# Patient Record
Sex: Female | Born: 1990 | Race: Black or African American | Hispanic: No | Marital: Single | Smoking: Never smoker
Health system: Southern US, Community
[De-identification: ages and names within clinical notes are randomized; demographics above are authoritative.]

## PROBLEM LIST (undated history)

## (undated) ENCOUNTER — Inpatient Hospital Stay (HOSPITAL_COMMUNITY): Payer: Self-pay

## (undated) DIAGNOSIS — B009 Herpesviral infection, unspecified: Secondary | ICD-10-CM

## (undated) HISTORY — PX: TONSILLECTOMY: SUR1361

---
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
## 2011-07-22 ENCOUNTER — Emergency Department (HOSPITAL_COMMUNITY)
Admission: EM | Admit: 2011-07-22 | Discharge: 2011-07-22 | Disposition: A | Discharge status: Home / Self Care | Payer: Medicaid Other | Attending: Emergency Medicine | Admitting: Emergency Medicine

## 2011-07-22 ENCOUNTER — Encounter: Payer: Self-pay | Admitting: *Deleted

## 2011-07-22 DIAGNOSIS — N390 Urinary tract infection, site not specified: Secondary | ICD-10-CM | POA: Insufficient documentation

## 2011-07-22 DIAGNOSIS — N898 Other specified noninflammatory disorders of vagina: Secondary | ICD-10-CM | POA: Insufficient documentation

## 2011-07-22 HISTORY — DX: Herpesviral infection, unspecified: B00.9

## 2011-07-22 LAB — URINALYSIS, ROUTINE W REFLEX MICROSCOPIC
Glucose, UA: NEGATIVE mg/dL
Protein, ur: NEGATIVE mg/dL
Urobilinogen, UA: 0.2 mg/dL (ref 0.0–1.0)

## 2011-07-22 LAB — URINE MICROSCOPIC-ADD ON

## 2011-07-22 LAB — POCT PREGNANCY, URINE: Preg Test, Ur: NEGATIVE

## 2011-07-22 MED ORDER — SULFAMETHOXAZOLE-TRIMETHOPRIM 800-160 MG PO TABS: 1.0000 | ORAL_TABLET | Freq: Two times a day (BID) | ORAL | Status: AC

## 2011-07-22 NOTE — ED Provider Notes (Signed)
History     CSN: [id number] Arrival date & time: 07/22/2011  3:08 PM   First MD Initiated Contact with Patient 07/22/11 1642    4:51 PM HPI Alaiah Lundy is a 20 y.o. female complaining of vaginal discharge and vulvar itching for 3-4 days. Reports symptoms also associated with dysuria, urinary frequency, urinary urgency. Denies abdominal pain fever or back pain. Admits to being sexually active. Uses OCP and condoms. 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 No history of pregnancies or abdominal surgeries. Patient is a 20 y.o. female presenting with vaginal discharge. The history is provided by the patient.  Vaginal Discharge This is a new problem. The current episode started in the past 7 days. The problem occurs constantly. The problem has been unchanged. Associated symptoms include urinary symptoms. Pertinent negatives include no abdominal pain, chills, congestion, coughing, fatigue, fever, nausea, neck pain, rash, vomiting or weakness. Associated symptoms comments: Vulvar pruritus. Treatments tried: Monistat 1. The treatment provided no relief.    Past Medical History  Diagnosis Date  . Herpes     Past Surgical History  Procedure Date  . Tonsillectomy     No family history on file.  History  Substance Use Topics  . Smoking status: Never Smoker   . Smokeless tobacco: Not on file  . Alcohol Use: No    OB History    Grav Para Term Preterm Abortions TAB SAB Ect Mult Living                  Review of Systems  Constitutional: Negative for fever, chills and fatigue.  HENT: Negative for congestion and neck pain.   Respiratory: Negative for cough.   Gastrointestinal: Negative for nausea, vomiting and abdominal pain.  Genitourinary: Positive for dysuria, urgency, frequency and vaginal discharge. Negative for vaginal pain.       Vulvar pruritis  Skin: Negative for rash.  Neurological: Negative for weakness.  All other systems reviewed and are negative.    Allergies  Review  of patient's allergies indicates no known allergies.  Home Medications   Current Outpatient Rx  Name Route Sig Dispense Refill  . LEVONORGESTREL-ETHINYL ESTRAD 0.1-20 MG-MCG PO TABS Oral Take 1 tablet by mouth daily.      Marland Kitchen VALACYCLOVIR HCL 1 G PO TABS Oral Take 1,000 mg by mouth daily.        BP 113/73  Pulse 86  Temp(Src) 98.5 F (36.9 C) (Oral)  Resp 16  Wt 141 lb 15.6 oz (64.4 kg)  SpO2 99%  LMP 07/01/2011  Physical Exam  Vitals reviewed. Constitutional: She is oriented to person, place, and time. Vital signs are normal. She appears well-developed and well-nourished.  HENT:  Head: Normocephalic and atraumatic.  Eyes: Conjunctivae are normal. Pupils are equal, round, and reactive to light.  Neck: Normal range of motion. Neck supple.  Cardiovascular: Normal rate, regular rhythm and normal heart sounds.  Exam reveals no friction rub.   No murmur heard. Pulmonary/Chest: Effort normal and breath sounds normal. She has no wheezes. She has no rhonchi. She has no rales. She exhibits no tenderness.  Abdominal: Soft. Bowel sounds are normal. She exhibits no distension and no mass. There is no tenderness. There is no rebound and no guarding.  Musculoskeletal: Normal range of motion.  Neurological: She is alert and oriented to person, place, and time. Coordination normal.  Skin: Skin is warm and dry. No rash noted. No erythema. No pallor.    ED Course  Procedures  MDM   5:55 PM Patient now states she wants to leave prior to having pelvic exam. Pelvic exam is set up and I am in the room I asked patient twice whether she definitely wants to leave. Advised patient that I would be unable to diagnose her vaginal discharge and vaginal itching without a pelvic exam. Patient states she does her care and would like to leave. States she would like a work note. Will give her prescription for Bactrim since urine came back positive for UTI.  Thomasene Lot, [place] 07/22/11 [phone]

## 2011-07-22 NOTE — ED Notes (Signed)
Pt states "I have been having some vaginal d/c, itching, thought I had a UTI, used some monistat, why do I keep having UTI's?"

## 2011-07-22 NOTE — ED Notes (Signed)
Pelvic set up for patient but, she refused.  PA  In to speak with her about refusing pelvic but, nonetheless she continues to refuse.

## 2011-07-26 NOTE — ED Provider Notes (Signed)
Evaluation and management procedures were performed by the PA/NP under my supervision/collaboration.    Felisa Bonier, MD 07/26/11 [phone]

## 2011-09-21 IMAGING — US US PELVIS COMPLETE MODIFY
1 series · 14 of 25 positions shown · non-contrast
Comparison: None.

CLINICAL DATA: Vaginal bleeding.  [place] menorrhagia

TRANSABDOMINAL AND TRANSVAGINAL ULTRASOUND OF PELVIS
TECHNIQUE: Both transabdominal and transvaginal ultrasound
examinations of the pelvis were performed including evaluation of
the uterus, ovaries, adnexal regions, and pelvic cul-de-sac.

[Series 1: us pelvis complete modify · 0.28mm/px · 14 of 33 slices shown]
[im 1/33]
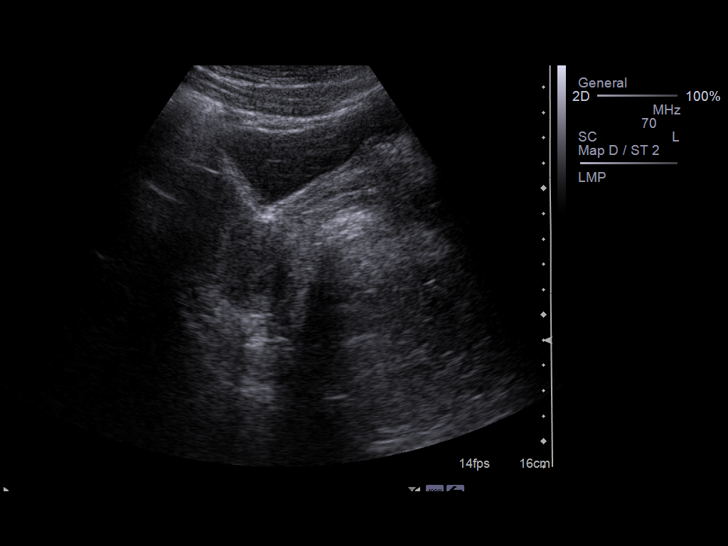
[im 3/33]
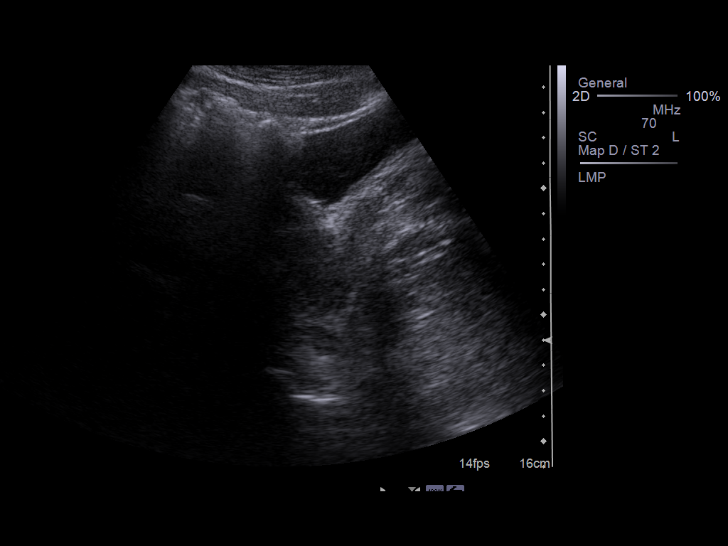
[im 6/33]
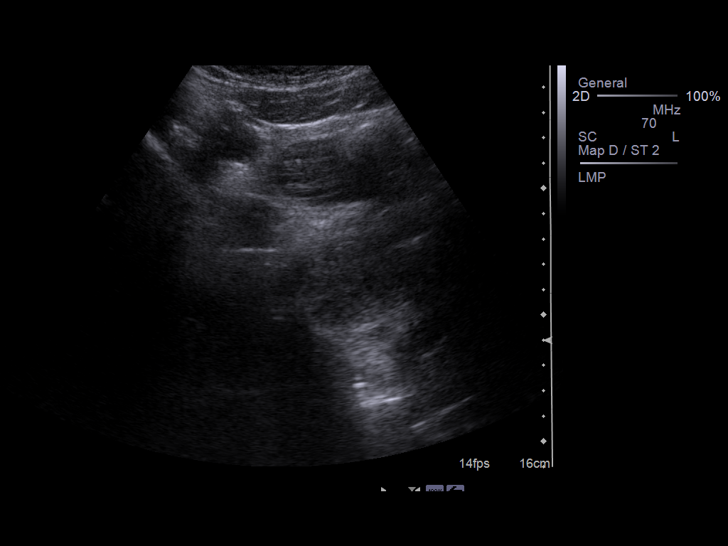
[im 9/33]
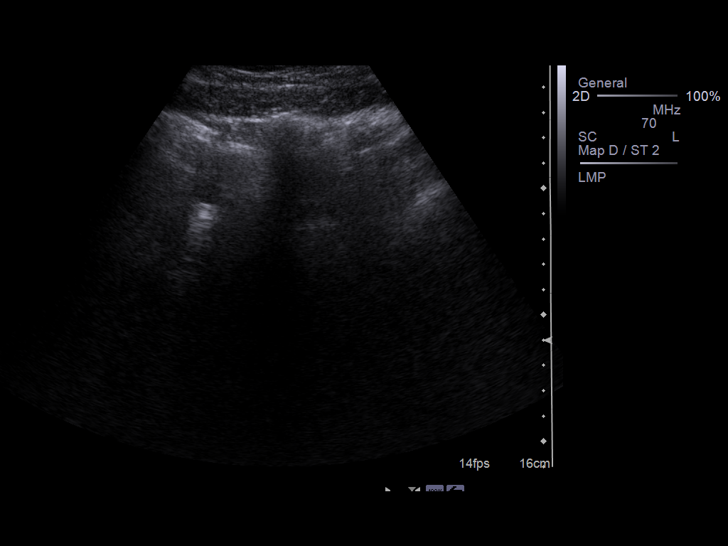
[im 11/33]
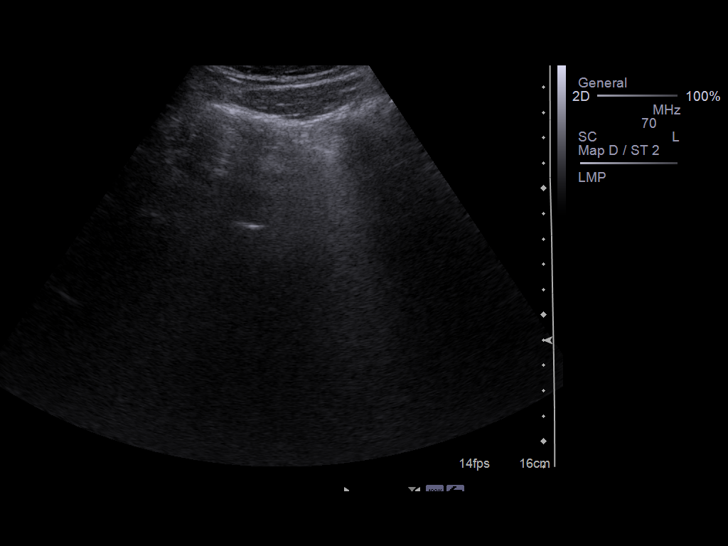
[im 13/33]
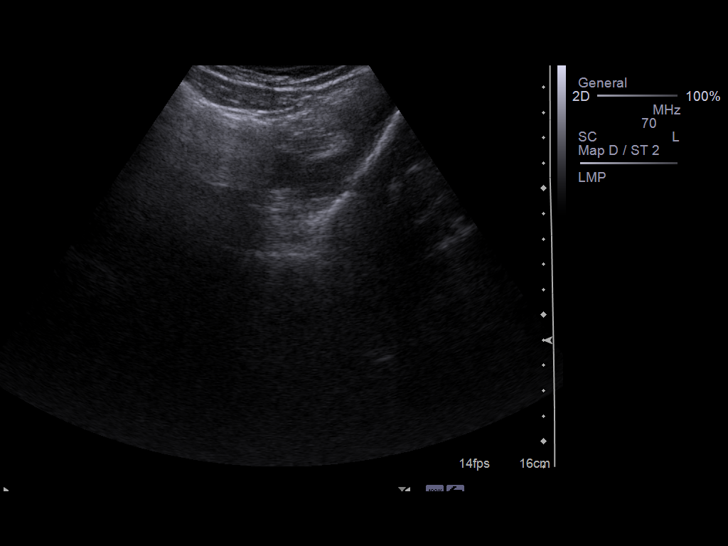
[im 15/33]
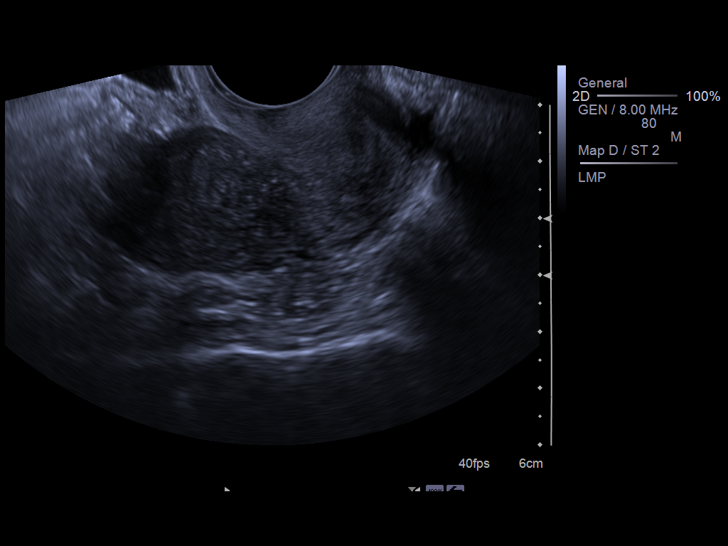
[im 18/33]
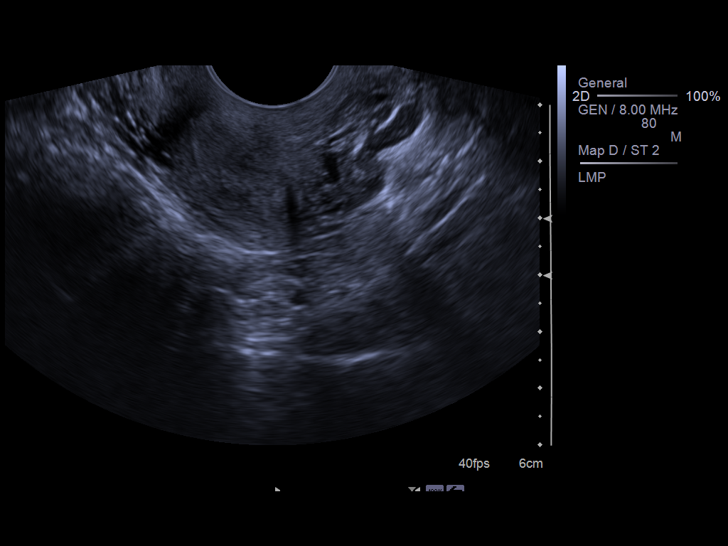
[im 21/33]
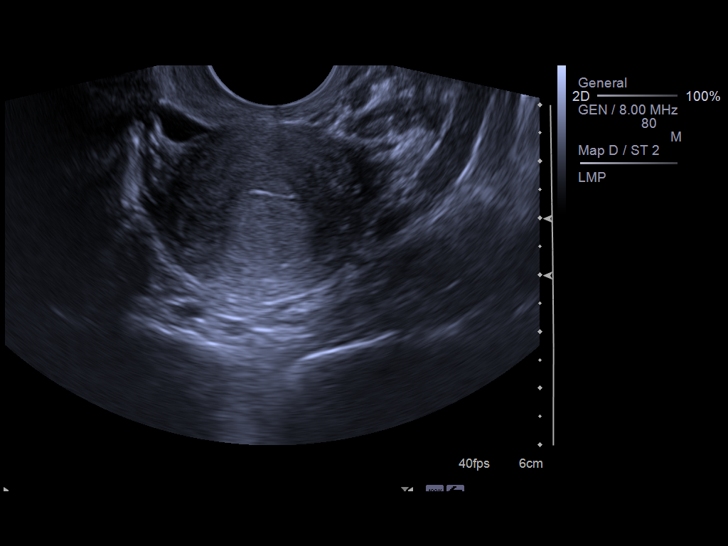
[im 22/33]
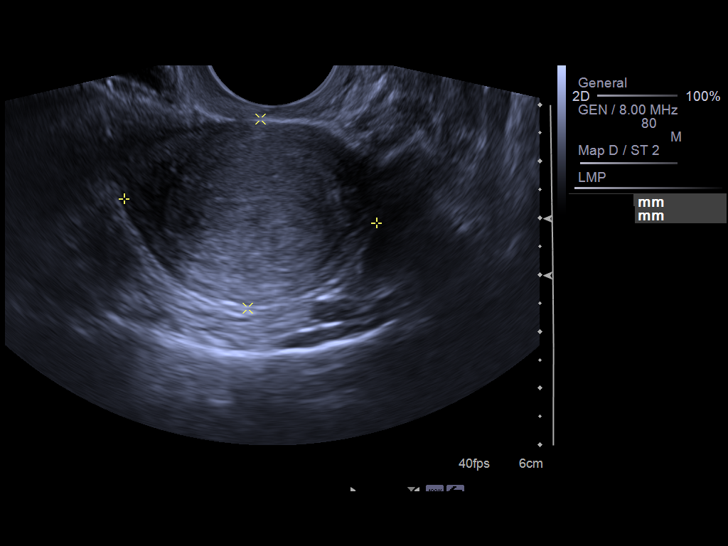
[im 25/33]
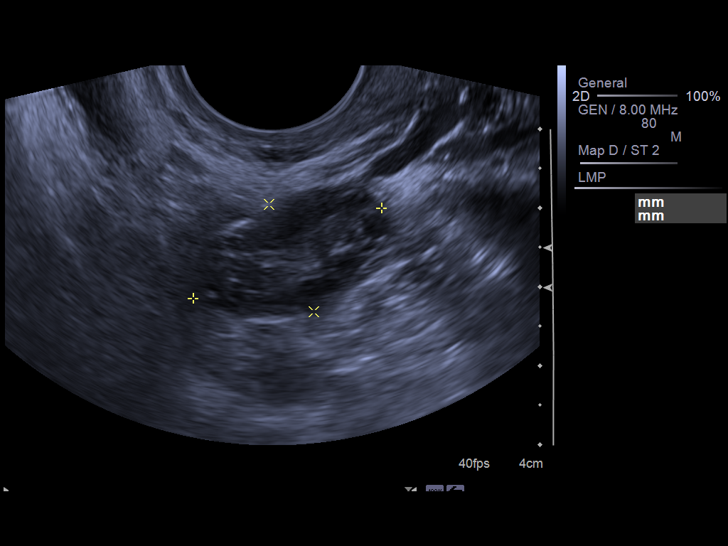
[im 27/33]
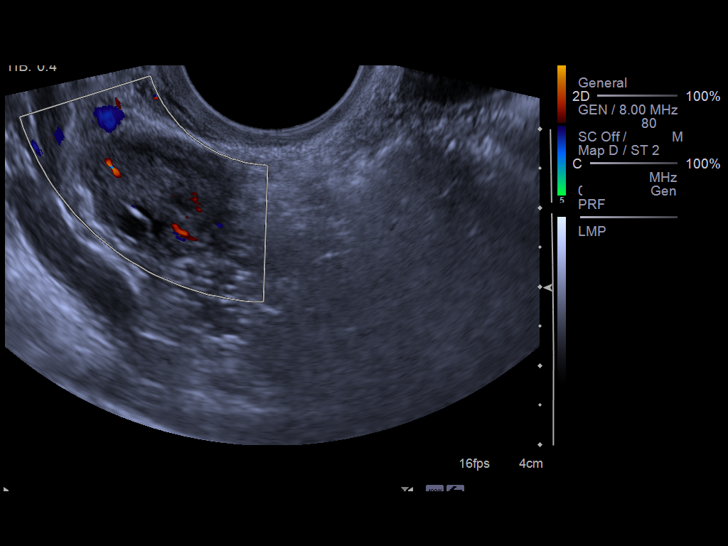
[im 30/33]
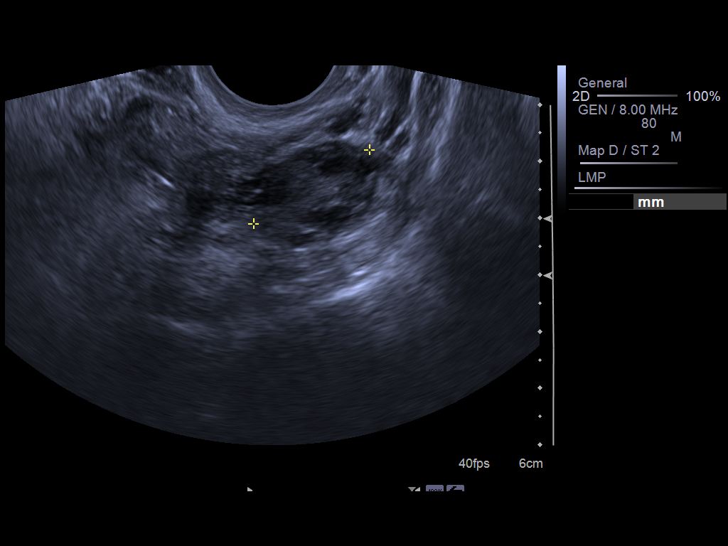
[im 33/33]
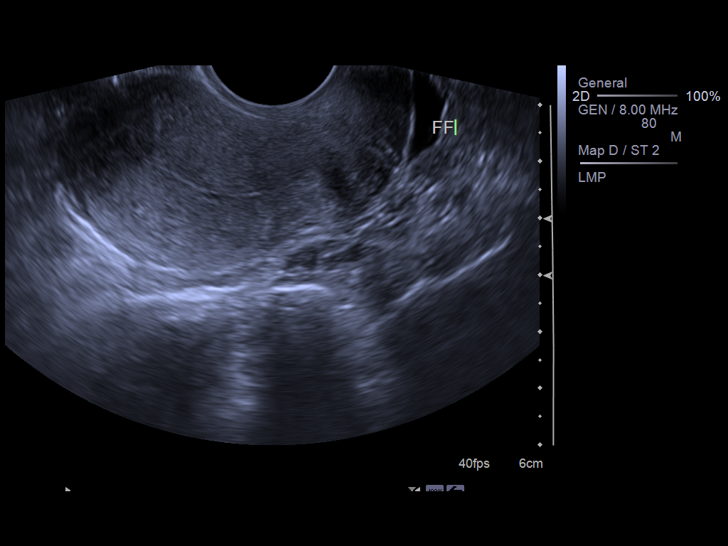

[14 of 25 positions shown; findings below may reference images not displayed]

FINDINGS: Uterus appears within normal limits measuring 5.7 x 3.3 x 4.5 cm.
Normal myometrial echotexture. Endometrial within normal limits at
1 mm.

Physiologic appearance of the ovaries bilaterally.  Right ovary
measures 2.6 x 1.5 x 1.7 cm.  Left ovary measures 2.8 x 2.0 x
cm.

Small amount of free fluid in the anatomic pelvis is probably
physiologic.
IMPRESSION: Normal pelvic ultrasound.

## 2013-08-02 LAB — OB RESULTS CONSOLE ANTIBODY SCREEN: Antibody Screen: NEGATIVE

## 2013-08-18 ENCOUNTER — Inpatient Hospital Stay (HOSPITAL_COMMUNITY)
Admission: AD | Admit: 2013-08-18 | Discharge: 2013-08-18 | Disposition: A | Discharge status: Home / Self Care | Payer: BC Managed Care – PPO | Source: Ambulatory Visit | Attending: Obstetrics and Gynecology | Admitting: Obstetrics and Gynecology

## 2013-08-18 ENCOUNTER — Encounter (HOSPITAL_COMMUNITY): Payer: Self-pay | Admitting: *Deleted

## 2013-08-18 DIAGNOSIS — R141 Gas pain: Secondary | ICD-10-CM | POA: Diagnosis not present

## 2013-08-18 DIAGNOSIS — O99891 Other specified diseases and conditions complicating pregnancy: Secondary | ICD-10-CM | POA: Insufficient documentation

## 2013-08-18 DIAGNOSIS — K5900 Constipation, unspecified: Secondary | ICD-10-CM | POA: Diagnosis not present

## 2013-08-18 DIAGNOSIS — R142 Eructation: Secondary | ICD-10-CM | POA: Insufficient documentation

## 2013-08-18 DIAGNOSIS — R109 Unspecified abdominal pain: Secondary | ICD-10-CM | POA: Diagnosis present

## 2013-08-18 DIAGNOSIS — R140 Abdominal distension (gaseous): Secondary | ICD-10-CM

## 2013-08-18 LAB — CBC
HCT: 37.7 % (ref 36.0–46.0)
Hemoglobin: 12.7 g/dL (ref 12.0–15.0)
MCH: 27.2 pg (ref 26.0–34.0)
MCHC: 33.7 g/dL (ref 30.0–36.0)
MCV: 80.7 fL (ref 78.0–100.0)
RBC: 4.67 MIL/uL (ref 3.87–5.11)

## 2013-08-18 LAB — URINALYSIS, ROUTINE W REFLEX MICROSCOPIC
Glucose, UA: NEGATIVE mg/dL
Ketones, ur: NEGATIVE mg/dL
Leukocytes, UA: NEGATIVE
Nitrite: NEGATIVE
Protein, ur: NEGATIVE mg/dL
Urobilinogen, UA: 0.2 mg/dL (ref 0.0–1.0)

## 2013-08-18 LAB — WET PREP, GENITAL
Clue Cells Wet Prep HPF POC: NONE SEEN
Trich, Wet Prep: NONE SEEN
Yeast Wet Prep HPF POC: NONE SEEN

## 2013-08-18 LAB — POCT PREGNANCY, URINE: Preg Test, Ur: POSITIVE — AB

## 2013-08-18 MED ORDER — SIMETHICONE 80 MG PO CHEW
80.0000 mg | CHEWABLE_TABLET | Freq: Once | ORAL | Status: AC
  Administered 2013-08-18: 80 mg via ORAL
  Filled 2013-08-18: qty 1

## 2013-08-18 NOTE — MAU Provider Note (Signed)
History     CSN: [id number]  Arrival date and time: 08/18/13 1452   First Provider Initiated Contact with Patient 08/18/13 1725      Chief Complaint  Patient presents with  . Abdominal Pain   HPI  [name] is a 22 y.o. female G2P0 at [redacted]w[redacted]d who presents with abdominal pain that she feels is related to "trapped gas". She feels like she has bubbles in her stomach. The pain started Thursday night while she was riding in the car; the pain as continued off and on. The pain comes on when she needs to have a bowel movement and once she does the pain subsides. The patient feels like she is constipated, however is unsure if that is what the problem is. When the patient does have a bowel movement  It is usually a small amount.  Her stools are soft, not usually hard. She currently rates her pain 4/10.  Pt has been seen in the office and has had an US done with this pregnancy.  OB History   Grav Para Term Preterm Abortions TAB SAB Ect Mult Living   2               Past Medical History  Diagnosis Date  . Herpes     Past Surgical History  Procedure Laterality Date  . Tonsillectomy      No family history on file.  History  Substance Use Topics  . Smoking status: Never Smoker   . Smokeless tobacco: Not on file  . Alcohol Use: No    Allergies: No Known Allergies  Prescriptions prior to admission  Medication Sig Dispense Refill  . Prenatal Vit-Fe Fumarate-FA (PRENATAL MULTIVITAMIN) TABS tablet Take 1 tablet by mouth daily at 12 noon.       Results for orders placed during the hospital encounter of 08/18/13 (from the past 24 hour(s))  URINALYSIS, ROUTINE W REFLEX MICROSCOPIC     Status: Abnormal   Collection Time    08/18/13  3:24 PM      Result Value Range   Color, Urine YELLOW  YELLOW   APPearance CLEAR  CLEAR   Specific Gravity, Urine 1.020  1.005 - 1.030   pH 7.0  5.0 - 8.0   Glucose, UA NEGATIVE  NEGATIVE mg/dL   Hgb urine dipstick TRACE (*) NEGATIVE   Bilirubin Urine NEGATIVE  NEGATIVE   Ketones, ur NEGATIVE  NEGATIVE mg/dL   Protein, ur NEGATIVE  NEGATIVE mg/dL   Urobilinogen, UA 0.2  0.0 - 1.0 mg/dL   Nitrite NEGATIVE  NEGATIVE   Leukocytes, UA NEGATIVE  NEGATIVE  URINE MICROSCOPIC-ADD ON     Status: Abnormal   Collection Time    08/18/13  3:24 PM      Result Value Range   Squamous Epithelial / LPF FEW (*) RARE   WBC, UA 0-2  <3 WBC/hpf   RBC / HPF 0-2  <3 RBC/hpf   Bacteria, UA FEW (*) RARE  POCT PREGNANCY, URINE     Status: Abnormal   Collection Time    08/18/13  3:31 PM      Result Value Range   Preg Test, Ur POSITIVE (*) NEGATIVE  CBC     Status: None   Collection Time    08/18/13  5:37 PM      Result Value Range   WBC 8.7  4.0 - 10.5 K/uL   RBC 4.67  3.87 - 5.11 MIL/uL   Hemoglobin 12.7  12.0 - 15.0 g/dL  HCT 37.7  36.0 - 46.0 %   MCV 80.7  78.0 - 100.0 fL   MCH 27.2  26.0 - 34.0 pg   MCHC 33.7  30.0 - 36.0 g/dL   RDW 13.0  86.5 - 78.4 %   Platelets 210  150 - 400 K/uL  WET PREP, GENITAL     Status: Abnormal   Collection Time    08/18/13  6:13 PM      Result Value Range   Yeast Wet Prep HPF POC NONE SEEN  NONE SEEN   Trich, Wet Prep NONE SEEN  NONE SEEN   Clue Cells Wet Prep HPF POC NONE SEEN  NONE SEEN   WBC, Wet Prep HPF POC MANY (*) NONE SEEN    Review of Systems  Constitutional: Negative for fever and chills.  Cardiovascular: Negative for chest pain.  Gastrointestinal: Positive for abdominal pain and constipation. Negative for nausea, vomiting and diarrhea.       Bilateral abdominal pain R lower quad > L   Genitourinary: Negative for dysuria, urgency, frequency, hematuria and flank pain.       No vaginal discharge. No vaginal bleeding. No dysuria.    Physical Exam   Blood pressure 123/70, pulse 77, temperature 99.1 F (37.3 C), temperature source Oral, resp. rate 18, height 5\' 3"  (1.6 m), weight 73.143 kg (161 lb 4 oz), last menstrual period 06/14/2013.  Physical Exam  Constitutional: She is  oriented to person, place, and time. She appears well-developed and well-nourished. No distress.  HENT:  Head: Normocephalic.  Eyes: Pupils are equal, round, and reactive to light.  Neck: Neck supple.  Respiratory: Effort normal.  GI: Soft. Normal appearance. There is tenderness in the right lower quadrant, periumbilical area and suprapubic area. There is no rigidity, no rebound, no guarding and no tenderness at McBurney's point.  Genitourinary:  Speculum exam: Vagina - Small amount of creamy discharge, no odor Cervix - No contact bleeding Bimanual exam: Cervix closed, no CMT Uterus non tender, normal size; gravid Adnexa non tender, no masses bilaterally, + suprapubic tenderness  GC/Chlam, wet prep done Chaperone present for exam.   Neurological: She is alert and oriented to person, place, and time.  Skin: Skin is warm. She is not diaphoretic.    MAU Course  Procedures None  MDM CBC UA  1820: pt currently rates her pain 0/10 80 mg simethicone tab given in MAU  Wet prep  GC/Chlamydia  Consulted with Dr. Tenny Craw; ok to send home.  Assessment and Plan   A: Constipation  Bloating   P: Discharge home in stable condition Discussed the use of miralax or metamucil as directed on the bottle Ok to use simethicone for gas/bloating pain Follow up with primary care dr. As scheduled     Jane Hansen Shae Augello, NP  08/18/2013, 5:25 PM

## 2013-08-18 NOTE — MAU Note (Signed)
Pt states here for lower abd pain that began Thursday PM, feels like trapped gas. LMP-06/14/2013. Denies bleeding, denies abnormal vaginal d/c

## 2013-08-23 NOTE — L&D Delivery Note (Signed)
Patient was C/C/+2 and pushed for approx 40 minutes with epidural.   NSVD  female infant, Apgars 8/9, weight pending.   The patient had a periurethral laceration repaired with 3-0 vicryl. Fundus was firm. EBL was expected. Placenta was delivered intact. Vagina was clear.  Baby was vigorous and doing skin to skin with mother.  Jane Hartman, Jane Hartman

## 2013-08-27 ENCOUNTER — Encounter (HOSPITAL_COMMUNITY): Payer: Self-pay | Admitting: *Deleted

## 2013-08-27 ENCOUNTER — Inpatient Hospital Stay (HOSPITAL_COMMUNITY): Payer: BC Managed Care – PPO

## 2013-08-27 ENCOUNTER — Inpatient Hospital Stay (HOSPITAL_COMMUNITY)
Admission: AD | Admit: 2013-08-27 | Discharge: 2013-08-27 | Disposition: A | Discharge status: Home / Self Care | Payer: BC Managed Care – PPO | Source: Ambulatory Visit | Attending: Obstetrics and Gynecology | Admitting: Obstetrics and Gynecology

## 2013-08-27 DIAGNOSIS — O3680X1 Pregnancy with inconclusive fetal viability, fetus 1: Secondary | ICD-10-CM

## 2013-08-27 DIAGNOSIS — O209 Hemorrhage in early pregnancy, unspecified: Secondary | ICD-10-CM | POA: Insufficient documentation

## 2013-08-27 DIAGNOSIS — R109 Unspecified abdominal pain: Secondary | ICD-10-CM | POA: Insufficient documentation

## 2013-08-27 DIAGNOSIS — O4690 Antepartum hemorrhage, unspecified, unspecified trimester: Secondary | ICD-10-CM

## 2013-08-27 LAB — URINALYSIS, ROUTINE W REFLEX MICROSCOPIC
BILIRUBIN URINE: NEGATIVE
Glucose, UA: NEGATIVE mg/dL
Ketones, ur: NEGATIVE mg/dL
LEUKOCYTES UA: NEGATIVE
NITRITE: NEGATIVE
PH: 6.5 (ref 5.0–8.0)
Protein, ur: NEGATIVE mg/dL
SPECIFIC GRAVITY, URINE: 1.015 (ref 1.005–1.030)
UROBILINOGEN UA: 0.2 mg/dL (ref 0.0–1.0)

## 2013-08-27 LAB — URINE MICROSCOPIC-ADD ON

## 2013-08-27 LAB — ABO/RH: ABO/RH(D): O POS

## 2013-08-27 NOTE — MAU Provider Note (Signed)
History     CSN: [id number]  Arrival date and time: 08/27/13 1124   None     Chief Complaint  Patient presents with  . Vaginal Bleeding  . Abdominal Pain   HPI  [name] is a 23 y.o. female G1P0 at 5812w4d who presents with vaginal spotting; dark red in color that started this morning. She is not complaining of any pain at this time, currently rates her pain 0/10. At times she does have some mild discomfort when she changes positions. Last intercourse was 1 week ago. She does not have to wear a pad with the bleeding, only notices it when she wipes. Pt was seen here a week ago for "gas pains" and feels that pain has subsided.   OB History   Grav Para Term Preterm Abortions TAB SAB Ect Mult Living   1               Past Medical History  Diagnosis Date  . Herpes     Past Surgical History  Procedure Laterality Date  . Tonsillectomy      History reviewed. No pertinent family history.  History  Substance Use Topics  . Smoking status: Never Smoker   . Smokeless tobacco: Not on file  . Alcohol Use: No    Allergies: No Known Allergies  Prescriptions prior to admission  Medication Sig Dispense Refill  . acetaminophen (TYLENOL) 500 MG tablet Take 500 mg by mouth daily as needed for moderate pain.      . Prenatal Vit-Fe Fumarate-FA (PRENATAL MULTIVITAMIN) TABS tablet Take 1 tablet by mouth daily at 12 noon.       Results for orders placed during the hospital encounter of 08/27/13 (from the past 48 hour(s))  URINALYSIS, ROUTINE W REFLEX MICROSCOPIC     Status: Abnormal   Collection Time    08/27/13 11:41 AM      Result Value Range   Color, Urine YELLOW  YELLOW   APPearance CLEAR  CLEAR   Specific Gravity, Urine 1.015  1.005 - 1.030   pH 6.5  5.0 - 8.0   Glucose, UA NEGATIVE  NEGATIVE mg/dL   Hgb urine dipstick MODERATE (*) NEGATIVE   Bilirubin Urine NEGATIVE  NEGATIVE   Ketones, ur NEGATIVE  NEGATIVE mg/dL   Protein, ur NEGATIVE  NEGATIVE mg/dL    Urobilinogen, UA 0.2  0.0 - 1.0 mg/dL   Nitrite NEGATIVE  NEGATIVE   Leukocytes, UA NEGATIVE  NEGATIVE  URINE MICROSCOPIC-ADD ON     Status: Abnormal   Collection Time    08/27/13 11:41 AM      Result Value Range   Squamous Epithelial / LPF FEW (*) RARE   RBC / HPF 0-2  <3 RBC/hpf   Results for orders placed during the hospital encounter of 08/27/13 (from the past 48 hour(s))  URINALYSIS, ROUTINE W REFLEX MICROSCOPIC     Status: Abnormal   Collection Time    08/27/13 11:41 AM      Result Value Range   Color, Urine YELLOW  YELLOW   APPearance CLEAR  CLEAR   Specific Gravity, Urine 1.015  1.005 - 1.030   pH 6.5  5.0 - 8.0   Glucose, UA NEGATIVE  NEGATIVE mg/dL   Hgb urine dipstick MODERATE (*) NEGATIVE   Bilirubin Urine NEGATIVE  NEGATIVE   Ketones, ur NEGATIVE  NEGATIVE mg/dL   Protein, ur NEGATIVE  NEGATIVE mg/dL   Urobilinogen, UA 0.2  0.0 - 1.0 mg/dL   Nitrite NEGATIVE  NEGATIVE   Leukocytes, UA NEGATIVE  NEGATIVE  URINE MICROSCOPIC-ADD ON     Status: Abnormal   Collection Time    08/27/13 11:41 AM      Result Value Range   Squamous Epithelial / LPF FEW (*) RARE   RBC / HPF 0-2  <3 RBC/hpf  ABO/RH     Status: None   Collection Time    08/27/13 12:40 PM      Result Value Range   ABO/RH(D) O POS     US Ob Comp Less 14 Wks  08/27/2013   CLINICAL DATA:  Threatened abortion.  EXAM: OBSTETRIC <14 WK ULTRASOUND  TECHNIQUE: Transabdominal ultrasound was performed for evaluation of the gestation as well as the maternal uterus and adnexal regions.  COMPARISON:  None.  FINDINGS: Intrauterine gestational sac: Visualized/normal in shape.  Yolk sac:  Visualized  Embryo:  Visualized  Cardiac Activity: Visualized  Heart Rate: 163 bpm  CRL:   42  mm   11 w 1 d                  [place] EDC: 03/17/2014  Maternal uterus/adnexae: No mass or free fluid identified. Both ovaries are normal in appearance.  IMPRESSION: Single living IUP measuring 11 weeks 1 day with [place] EDC of 03/17/2014.  No significant  maternal uterine or adnexal abnormality identified.   Electronically Signed   By: Myles Rosenthal M.D.   On: 08/27/2013 13:23    Review of Systems  Constitutional: Negative for fever and chills.  Gastrointestinal: Negative for heartburn, nausea, vomiting, abdominal pain, diarrhea and constipation.  Genitourinary: Negative for dysuria, urgency, frequency and hematuria.       No vaginal discharge. + vaginal bleeding; small amount, dark red  No dysuria.   Musculoskeletal: Negative for back pain.   Physical Exam   Blood pressure 120/77, pulse 88, temperature 99.1 F (37.3 C), temperature source Oral, resp. rate 16, weight 73.664 kg (162 lb 6.4 oz), last menstrual period 06/14/2013, SpO2 99.00%.  Physical Exam  Constitutional: She is oriented to person, place, and time. She appears well-developed and well-nourished. No distress.  HENT:  Head: Normocephalic.  Eyes: Pupils are equal, round, and reactive to light.  Neck: Neck supple.  Respiratory: Effort normal.  GI: Soft. She exhibits no distension. There is no tenderness. There is no rebound and no guarding.  Genitourinary:  Speculum exam: Vagina - Small amount of dark red vaginal bleeding, no odor Cervix - No contact bleeding, quarter size blood clot at cervical os. Bimanual exam: Cervix closed, no CMT  Uterus non tender, normal size, gravid  Adnexa non tender, no masses bilaterally Chaperone present for exam.   Neurological: She is alert and oriented to person, place, and time.  Skin: Skin is warm. She is not diaphoretic.    MAU Course  Procedures None  MDM +fht  UA  ABO/RH O positive blood type  US transvaginal  Consulted with Dr. Claiborne Billings; ok to discharge patient and have her follow up in the office as scheduled   Assessment and Plan   A: Vaginal bleeding complicating pregnancy; first trimester  P: Discharge home in stable condition Pelvic rest discussed Bleeding precautions discussed Return to MAU as needed, if  symptoms worsen  Iona Hansen Rasch, NP  08/27/2013, 12:28 PM

## 2013-08-27 NOTE — MAU Note (Signed)
Patient states she had dark red blood on the tissue with wiping this am. Not wearing a pad. Has some mild cramping since 12-25 but has a "different" pain for a couple of days.

## 2013-12-31 ENCOUNTER — Encounter (HOSPITAL_COMMUNITY): Payer: Self-pay | Admitting: *Deleted

## 2013-12-31 ENCOUNTER — Encounter (HOSPITAL_COMMUNITY): Payer: Self-pay | Admitting: Emergency Medicine

## 2013-12-31 ENCOUNTER — Inpatient Hospital Stay (HOSPITAL_COMMUNITY)
Admission: AD | Admit: 2013-12-31 | Discharge: 2013-12-31 | Disposition: A | Discharge status: Home / Self Care | Payer: BC Managed Care – PPO | Source: Ambulatory Visit | Attending: Obstetrics and Gynecology | Admitting: Obstetrics and Gynecology

## 2013-12-31 DIAGNOSIS — L0291 Cutaneous abscess, unspecified: Secondary | ICD-10-CM | POA: Insufficient documentation

## 2013-12-31 DIAGNOSIS — O99891 Other specified diseases and conditions complicating pregnancy: Secondary | ICD-10-CM | POA: Diagnosis not present

## 2013-12-31 DIAGNOSIS — R220 Localized swelling, mass and lump, head: Secondary | ICD-10-CM | POA: Diagnosis not present

## 2013-12-31 DIAGNOSIS — L0390 Cellulitis, unspecified: Principal | ICD-10-CM

## 2013-12-31 DIAGNOSIS — O9989 Other specified diseases and conditions complicating pregnancy, childbirth and the puerperium: Principal | ICD-10-CM

## 2013-12-31 DIAGNOSIS — J029 Acute pharyngitis, unspecified: Secondary | ICD-10-CM | POA: Diagnosis present

## 2013-12-31 DIAGNOSIS — R21 Rash and other nonspecific skin eruption: Secondary | ICD-10-CM | POA: Diagnosis not present

## 2013-12-31 DIAGNOSIS — R221 Localized swelling, mass and lump, neck: Secondary | ICD-10-CM

## 2013-12-31 LAB — RAPID STREP SCREEN (MED CTR MEBANE ONLY): Streptococcus, Group A Screen (Direct): NEGATIVE

## 2013-12-31 NOTE — ED Notes (Signed)
Pt reports cyst under chin x 3 days progressively worsening. Painful to palpation. Denies SOB, difficulty swallowing. States "it just hurts to swallow." Speaks in complete sentences. NAD. VSS. Pt [redacted] weeks pregnant.

## 2013-12-31 NOTE — Discharge Instructions (Signed)
Sore Throat A sore throat is pain, burning, irritation, or scratchiness of the throat. There is often pain or tenderness when swallowing or talking. A sore throat may be accompanied by other symptoms, such as coughing, sneezing, fever, and swollen neck glands. A sore throat is often the first sign of another sickness, such as a cold, flu, strep throat, or mononucleosis (commonly known as mono). Most sore throats go away without medical treatment. CAUSES  The most common causes of a sore throat include:  A viral infection, such as a cold, flu, or mono.  A bacterial infection, such as strep throat, tonsillitis, or whooping cough.  Seasonal allergies.  Dryness in the air.  Irritants, such as smoke or pollution.  Gastroesophageal reflux disease (GERD). HOME CARE INSTRUCTIONS   Only take over-the-counter medicines as directed by your caregiver.  Drink enough fluids to keep your urine clear or pale yellow.  Rest as needed.  Try using throat sprays, lozenges, or sucking on hard candy to ease any pain (if older than 4 years or as directed).  Sip warm liquids, such as broth, herbal tea, or warm water with honey to relieve pain temporarily. You may also eat or drink cold or frozen liquids such as frozen ice pops.  Gargle with salt water (mix 1 tsp salt with 8 oz of water).  Do not smoke and avoid secondhand smoke.  Put a cool-mist humidifier in your bedroom at night to moisten the air. You can also turn on a hot shower and sit in the bathroom with the door closed for 5 10 minutes. SEEK IMMEDIATE MEDICAL CARE IF:  You have difficulty breathing.  You are unable to swallow fluids, soft foods, or your saliva.  You have increased swelling in the throat.  Your sore throat does not get better in 7 days.  You have nausea and vomiting.  You have a fever or persistent symptoms for more than 2 3 days.  You have a fever and your symptoms suddenly get worse. MAKE SURE YOU:   Understand  these instructions.  Will watch your condition.  Will get help right away if you are not doing well or get worse. Document Released: 09/16/2004 Document Revised: 07/26/2012 Document Reviewed: 04/16/2012 [employer] Patient Information 2014 [employer].  

## 2013-12-31 NOTE — MAU Provider Note (Signed)
History     CSN: [id number]  Arrival date and time: 12/31/13 1744   First Provider Initiated Contact with Patient 12/31/13 1914      Chief Complaint  Patient presents with  . Mass   HPI This is a 23 y.o. female at 6921w4d who presents with c/o sore throat and lump under chin. Woke up 2 days ago and "felt bad".  Noticed a pustular rash on chin and lump under chin.  Denies fever or congestion.  RN Note:  Patient states she has had a lump under her chin for the past 3 days that has caused her to have a cough, sore throat and difficulty breathing. Denies any pregnancy problems. Denies bleeding, leaking or bleeding and reports good fetal movement.         OB History   Grav Para Term Preterm Abortions TAB SAB Ect Mult Living   1               Past Medical History  Diagnosis Date  . Herpes     Past Surgical History  Procedure Laterality Date  . Tonsillectomy      History reviewed. No pertinent family history.  History  Substance Use Topics  . Smoking status: Never Smoker   . Smokeless tobacco: Not on file  . Alcohol Use: No    Allergies: No Known Allergies  Prescriptions prior to admission  Medication Sig Dispense Refill  . acetaminophen (TYLENOL) 500 MG tablet Take 500 mg by mouth daily as needed for moderate pain.      . calcium carbonate (TUMS - DOSED IN MG ELEMENTAL CALCIUM) 500 MG chewable tablet Chew 2 tablets by mouth daily as needed for indigestion or heartburn.      . Prenatal Vit-Fe Fumarate-FA (PRENATAL MULTIVITAMIN) TABS tablet Take 1 tablet by mouth daily at 12 noon.        Review of Systems  Constitutional: Negative for fever, chills and malaise/fatigue.  HENT: Positive for sore throat. Negative for congestion.        Lump under throat   Gastrointestinal: Negative for abdominal pain.  Skin: Positive for rash (on chin).  Neurological: Negative for dizziness and headaches.   Physical Exam   Blood pressure 124/81, pulse 96, temperature 98.8 F  (37.1 C), temperature source Oral, resp. rate 16, height 5' 1.5" (1.562 m), weight 85.004 kg (187 lb 6.4 oz), last menstrual period 06/14/2013, SpO2 99.00%.  Physical Exam  Constitutional: She is oriented to person, place, and time. She appears well-developed and well-nourished. No distress.  HENT:  Head: Normocephalic.    Pustular rash (looks like impetigo)   Neck: Normal range of motion. Neck supple. No tracheal deviation present.    Grape sized mass, soft, under chin No other lymph nodes palpable, except for one small one on left just under jaw.  Throat slightly pink, no pus, tonsils absent   Cardiovascular: Normal rate.   Respiratory: Effort normal. No stridor.  GI: Soft. There is no tenderness. There is no rebound and no guarding.  Musculoskeletal: Normal range of motion.  Lymphadenopathy:    She has cervical adenopathy.  Neurological: She is alert and oriented to person, place, and time.  Skin: Skin is warm and dry. Rash (Pustular crusted rash on chin) noted.  Psychiatric: She has a normal mood and affect.    MAU Course  Procedures  MDM Rapid Strep sent.   Assessment and Plan  A:  SIUP at 2121w4d        Sore throat  Lump under throat        Rash on chin  P:  Discharge home per Dr Claiborne Billingsallahan       [facility]       No antibiotics for now until evaluated by other doctor  Jane SignsMarie L Ivionna Hartman 12/31/2013, 7:33 PM

## 2013-12-31 NOTE — MAU Note (Signed)
Patient states she has had a lump under her chin for the past 3 days that has caused her to have a cough, sore throat and difficulty breathing. Denies any pregnancy problems. Denies bleeding, leaking or bleeding and reports good fetal movement.

## 2013-12-31 NOTE — MAU Note (Signed)
Urine in lab 

## 2014-01-01 ENCOUNTER — Emergency Department (HOSPITAL_COMMUNITY)
Admission: EM | Admit: 2014-01-01 | Discharge: 2014-01-01 | Disposition: A | Discharge status: Home / Self Care | Payer: BC Managed Care – PPO | Attending: Emergency Medicine | Admitting: Emergency Medicine

## 2014-01-01 ENCOUNTER — Emergency Department (HOSPITAL_COMMUNITY): Payer: BC Managed Care – PPO

## 2014-01-01 ENCOUNTER — Emergency Department (HOSPITAL_COMMUNITY)
Admission: EM | Admit: 2014-01-01 | Discharge: 2014-01-01 | Discharge status: Against Medical Advice | Payer: BC Managed Care – PPO | Attending: Emergency Medicine | Admitting: Emergency Medicine

## 2014-01-01 ENCOUNTER — Encounter (HOSPITAL_COMMUNITY): Payer: Self-pay | Admitting: Emergency Medicine

## 2014-01-01 DIAGNOSIS — Z79899 Other long term (current) drug therapy: Secondary | ICD-10-CM | POA: Insufficient documentation

## 2014-01-01 DIAGNOSIS — I889 Nonspecific lymphadenitis, unspecified: Secondary | ICD-10-CM

## 2014-01-01 DIAGNOSIS — O9989 Other specified diseases and conditions complicating pregnancy, childbirth and the puerperium: Secondary | ICD-10-CM | POA: Insufficient documentation

## 2014-01-01 DIAGNOSIS — Z8619 Personal history of other infectious and parasitic diseases: Secondary | ICD-10-CM | POA: Insufficient documentation

## 2014-01-01 DIAGNOSIS — J029 Acute pharyngitis, unspecified: Secondary | ICD-10-CM | POA: Insufficient documentation

## 2014-01-01 MED ORDER — VALACYCLOVIR HCL 1 G PO TABS: 1000.0000 mg | ORAL_TABLET | Freq: Two times a day (BID) | ORAL | Status: DC

## 2014-01-01 MED ORDER — CLINDAMYCIN HCL 150 MG PO CAPS: 300.0000 mg | ORAL_CAPSULE | Freq: Four times a day (QID) | ORAL | Status: DC

## 2014-01-01 NOTE — ED Provider Notes (Signed)
Medical screening examination/treatment/procedure(s) were conducted as a shared visit with non-physician practitioner(s) and myself.  I personally evaluated the patient during the encounter. Pt presents with several painful enlarged nodular submental lesions and pain w/ swallowing. No fevers, dental pain, SOB, trismus. Exam no c/w Ludwig's angina. Posterior oropharynx clear, sublingual area soft, nontender. US showed several mildly enlarged lymph nodes. Given size of the nodes and assoc pain, there is concern for lymphadenitis. Will start on abx, have her return for new or worsening symptoms.    EKG Interpretation None        Shanna CiscoMegan E Estevon Fluke, MD 01/01/14 2158

## 2014-01-01 NOTE — ED Provider Notes (Signed)
CSN: [id number]     Arrival date & time 01/01/14  0908 History   First MD Initiated Contact with Patient 01/01/14 [phone]     Chief Complaint  Patient presents with  . Abscess     (Consider location/radiation/quality/duration/timing/severity/associated sxs/prior Treatment) HPI Comments: Patient is a 23 year old otherwise healthy female who presents today for evaluation of "knots" under her chin. She initially noticed them 4 days ago and feel as though they are gradually worsening. She has pain with swallowing and with palpation. No dental pain or trismus. Patient denies fevers, chills, nausea, vomiting, abdominal pain. She denies any shortness of breath or difficulty breathing. Patient is [redacted] weeks pregnant.   The history is provided by the patient. No language interpreter was used.    Past Medical History  Diagnosis Date  . Herpes    Past Surgical History  Procedure Laterality Date  . Tonsillectomy     History reviewed. No pertinent family history. History  Substance Use Topics  . Smoking status: Never Smoker   . Smokeless tobacco: Not on file  . Alcohol Use: No   OB History   Grav Para Term Preterm Abortions TAB SAB Ect Mult Living   1              Review of Systems  Constitutional: Negative for fever and chills.  HENT: Positive for sore throat and trouble swallowing.   Respiratory: Negative for shortness of breath.   Cardiovascular: Negative for chest pain.  Gastrointestinal: Negative for nausea, vomiting and abdominal pain.  All other systems reviewed and are negative.     Allergies  Review of patient's allergies indicates no known allergies.  Home Medications   Prior to Admission medications   Medication Sig Start Date End Date Taking? Authorizing Provider  acetaminophen (TYLENOL) 500 MG tablet Take 500 mg by mouth daily as needed for mild pain or moderate pain.    Yes Historical Provider, MD  calcium carbonate (TUMS - DOSED IN MG ELEMENTAL CALCIUM) 500 MG  chewable tablet Chew 2 tablets by mouth daily as needed for indigestion or heartburn.   Yes Historical Provider, MD  Prenatal Vit-Fe Fumarate-FA (PRENATAL MULTIVITAMIN) TABS tablet Take 1 tablet by mouth daily at 12 noon.   Yes Historical Provider, MD   BP 125/79  Pulse 98  Temp(Src) 98.6 F (37 C) (Oral)  Resp 16  Ht 5\' 3"  (1.6 m)  Wt 187 lb (84.823 kg)  BMI 33.13 kg/m2  SpO2 97%  LMP 06/14/2013 Physical Exam  Nursing note and vitals reviewed. Constitutional: She is oriented to person, place, and time. She appears well-developed and well-nourished. No distress.  HENT:  Head: Normocephalic and atraumatic.  Right Ear: External ear normal.  Left Ear: External ear normal.  Nose: Nose normal.  Mouth/Throat: Oropharynx is clear and moist.  No trismus, submental edema, tongue elevation.  Patient with areas of induration to inferior aspect of chin. No erythema, swelling, fluctuance.   Eyes: Conjunctivae are normal.  Neck: Normal range of motion.  Cardiovascular: Normal rate, regular rhythm and normal heart sounds.   Pulmonary/Chest: Effort normal and breath sounds normal. No stridor. No respiratory distress. She has no wheezes. She has no rales.  Abdominal: Soft. She exhibits no distension.  Musculoskeletal: Normal range of motion.  Neurological: She is alert and oriented to person, place, and time. She has normal strength.  Skin: Skin is warm and dry. She is not diaphoretic. No erythema.  Psychiatric: She has a normal mood and affect. Her  behavior is normal.    ED Course  Procedures (including critical care time) Labs Review Labs Reviewed - No data to display  Imaging Review [place] Soft Tissue Head/neck  01/01/2014   CLINICAL DATA:  Swelling of the submental soft tissues ; the patient is [redacted] weeks pregnant  EXAM: ULTRASOUND OF HEAD/NECK SOFT TISSUES  TECHNIQUE: Ultrasound examination of the head and neck soft tissues was performed in the area of clinical concern.  COMPARISON:  None.   FINDINGS: In the submental region there are prominent hypoechoic structures compatible with borderline to mildly enlarged lymph nodes. On the right a node measuring 1.3 x 1.0 x 1.2 cm is demonstrated. An adjacent 1.0 x 0.7 x 0.7 cm diameter hypoechoic nodule is present. On the left there is a 1.0 x 0.6 x 1.0 cm hypoechoic nodule. Vascularity is present within each in a fashion consistent with a hilar architecture.  IMPRESSION: There are hypoechoic nodules in the submental region consistent with borderline to mildly enlarged lymph nodes.   Electronically Signed   By: David  [place]il[place]   On: 01/01/2014 12:34     EKG Interpretation None      MDM   Final diagnoses:  Lymphadenitis   Patient with enlarged lymph nodes as seen on ultrasound. No trismus, afebrile, no dental pain. Patient is very well appearing. Will cover with clindamycin for lymphadenitis. Discussed reasons to return to ED immediately. She will follow up with PCP. Vital signs stable for discharge. Dr. Micheline Mazeocherty evaluated patient and agrees with plan. Patient / Family / Caregiver informed of clinical course, understand medical decision-making process, and agree with plan.   Mora BellmanHannah S Pierson Vantol, PA-C 01/01/14 1513

## 2014-01-01 NOTE — ED Notes (Signed)
Called pt unable to find 

## 2014-01-01 NOTE — Discharge Instructions (Signed)
Swollen Lymph Nodes  The lymphatic system filters fluid from around cells. It is like a system of blood vessels. These channels carry lymph instead of blood. The lymphatic system is an important part of the immune (disease fighting) system. When people talk about "swollen glands in the neck," they are usually talking about swollen lymph nodes. The lymph nodes are like the [place] traps for infection. You and your caregiver may be able to feel lymph nodes, especially swollen nodes, in these common areas: the groin (inguinal area), armpits (axilla), and above the clavicle (supraclavicular). You may also feel them in the neck (cervical) and the back of the head just above the hairline (occipital).  Swollen glands occur when there is any condition in which the body responds with an allergic type of reaction. For instance, the glands in the neck can become swollen from insect bites or any type of minor infection on the head. These are very noticeable in children with only minor problems. Lymph nodes may also become swollen when there is a tumor or problem with the lymphatic system, such as Hodgkin's disease.  TREATMENT   · Most swollen glands do not require treatment. They can be observed (watched) for a short period of time, if your caregiver feels it is necessary. Most of the time, observation is not necessary.  · Antibiotics (medicines that kill germs) may be prescribed by your caregiver. Your caregiver may prescribe these if he or she feels the swollen glands are due to a bacterial (germ) infection. Antibiotics are not used if the swollen glands are caused by a virus.  HOME CARE INSTRUCTIONS   · Take medications as directed by your caregiver. Only take over-the-counter or prescription medicines for pain, discomfort, or fever as directed by your caregiver.  SEEK MEDICAL CARE IF:   · If you begin to run a temperature greater than 102° F (38.9° C), or as your caregiver suggests.  MAKE SURE YOU:   · Understand these  instructions.  · Will watch your condition.  · Will get help right away if you are not doing well or get worse.  Document Released: 07/30/2002 Document Revised: 11/01/2011 Document Reviewed: 08/09/2005  ExitCare® Patient Information ©2014 [employer].

## 2014-01-01 NOTE — ED Notes (Signed)
Pt reports having "knot under her chin" x 4 days. Pain when swallowing. Airway is intact. Pt is approx [redacted] weeks pregnant.

## 2014-01-02 LAB — CULTURE, GROUP A STREP

## 2014-02-04 ENCOUNTER — Inpatient Hospital Stay (HOSPITAL_COMMUNITY)
Admission: AD | Admit: 2014-02-04 | Discharge: 2014-02-04 | Disposition: A | Discharge status: Home / Self Care | Payer: BC Managed Care – PPO | Source: Ambulatory Visit | Attending: Obstetrics & Gynecology | Admitting: Obstetrics & Gynecology

## 2014-02-04 ENCOUNTER — Encounter (HOSPITAL_COMMUNITY): Payer: Self-pay | Admitting: *Deleted

## 2014-02-04 DIAGNOSIS — O26859 Spotting complicating pregnancy, unspecified trimester: Secondary | ICD-10-CM | POA: Insufficient documentation

## 2014-02-04 DIAGNOSIS — O4703 False labor before 37 completed weeks of gestation, third trimester: Secondary | ICD-10-CM

## 2014-02-04 DIAGNOSIS — O4700 False labor before 37 completed weeks of gestation, unspecified trimester: Secondary | ICD-10-CM | POA: Insufficient documentation

## 2014-02-04 LAB — URINALYSIS, ROUTINE W REFLEX MICROSCOPIC
Bilirubin Urine: NEGATIVE
GLUCOSE, UA: NEGATIVE mg/dL
Hgb urine dipstick: NEGATIVE
Ketones, ur: NEGATIVE mg/dL
Leukocytes, UA: NEGATIVE
Nitrite: NEGATIVE
PROTEIN: NEGATIVE mg/dL
SPECIFIC GRAVITY, URINE: 1.01 (ref 1.005–1.030)
Urobilinogen, UA: 0.2 mg/dL (ref 0.0–1.0)
pH: 7 (ref 5.0–8.0)

## 2014-02-04 MED ORDER — METOCLOPRAMIDE HCL 10 MG PO TABS
10.0000 mg | ORAL_TABLET | Freq: Once | ORAL | Status: AC
  Administered 2014-02-04: 10 mg via ORAL
  Filled 2014-02-04: qty 1

## 2014-02-04 MED ORDER — FAMOTIDINE 20 MG PO TABS
20.0000 mg | ORAL_TABLET | Freq: Once | ORAL | Status: AC
  Administered 2014-02-04: 20 mg via ORAL
  Filled 2014-02-04: qty 1

## 2014-02-04 NOTE — Discharge Instructions (Signed)
Preterm Labor Information Preterm labor is when labor starts at less than 37 weeks of pregnancy. The normal length of a pregnancy is 39 to 41 weeks. CAUSES Often, there is no identifiable underlying cause as to why a woman goes into preterm labor. One of the most common known causes of preterm labor is infection. Infections of the uterus, cervix, vagina, amniotic sac, bladder, kidney, or even the lungs (pneumonia) can cause labor to start. Other suspected causes of preterm labor include:   Urogenital infections, such as yeast infections and bacterial vaginosis.   Uterine abnormalities (uterine shape, uterine septum, fibroids, or bleeding from the placenta).   A cervix that has been operated on (it may fail to stay closed).   Malformations in the fetus.   Multiple gestations (twins, triplets, and so on).   Breakage of the amniotic sac.  RISK FACTORS  Having a previous history of preterm labor.   Having premature rupture of membranes (PROM).   Having a placenta that covers the opening of the cervix (placenta previa).   Having a placenta that separates from the uterus (placental abruption).   Having a cervix that is too weak to hold the fetus in the uterus (incompetent cervix).   Having too much fluid in the amniotic sac (polyhydramnios).   Taking illegal drugs or smoking while pregnant.   Not gaining enough weight while pregnant.   Being younger than 18 and older than 22 years old.   Having a low socioeconomic status.   Being African American. SYMPTOMS Signs and symptoms of preterm labor include:   Menstrual-like cramps, abdominal pain, or back pain.  Uterine contractions that are regular, as frequent as six in an hour, regardless of their intensity (may be mild or painful).  Contractions that start on the top of the uterus and spread down to the lower abdomen and back.   A sense of increased pelvic pressure.   A watery or bloody mucus discharge that  comes from the vagina.  TREATMENT Depending on the length of the pregnancy and other circumstances, your health care provider may suggest bed rest. If necessary, there are medicines that can be given to stop contractions and to mature the fetal lungs. If labor happens before 34 weeks of pregnancy, a prolonged hospital stay may be recommended. Treatment depends on the condition of both you and the fetus.  WHAT SHOULD YOU DO IF YOU THINK YOU ARE IN PRETERM LABOR? Call your health care provider right away. You will need to go to the hospital to get checked immediately. HOW CAN YOU PREVENT PRETERM LABOR IN FUTURE PREGNANCIES? You should:   Stop smoking if you smoke.  Maintain healthy weight gain and avoid chemicals and drugs that are not necessary.  Be watchful for any type of infection.  Inform your health care provider if you have a known history of preterm labor. Document Released: 10/30/2003 Document Revised: 04/11/2013 Document Reviewed: 09/11/2012 Exit[employer] Patient Information 2014 [employer].    

## 2014-02-04 NOTE — MAU Note (Signed)
Cramping since yesterday. Was mild and did not think anything of it. States her menstrual cramps are horrible and this is nothing like that.

## 2014-02-04 NOTE — MAU Provider Note (Signed)
History     CSN: [id number]  Arrival date and time: 02/04/14 1003   None     Chief Complaint  Patient presents with  . Vaginal Bleeding   HPI This is a 23 y.o. female at 6467w4d who presents with c/o spotting this morning.Denies problems with pregnancy or recent IC.    RN Note:  Had a Hubert spotting this morning. Denies hx of previa or low lying placenta; no recent intercourse.       OB History   Grav Para Term Preterm Abortions TAB SAB Ect Mult Living   1               Past Medical History  Diagnosis Date  . Herpes     Past Surgical History  Procedure Laterality Date  . Tonsillectomy      No family history on file.  History  Substance Use Topics  . Smoking status: Never Smoker   . Smokeless tobacco: Not on file  . Alcohol Use: No    Allergies: No Known Allergies  Prescriptions prior to admission  Medication Sig Dispense Refill  . acetaminophen (TYLENOL) 500 MG tablet Take 500 mg by mouth daily as needed for mild pain or moderate pain.       . calcium carbonate (TUMS - DOSED IN MG ELEMENTAL CALCIUM) 500 MG chewable tablet Chew 2 tablets by mouth daily as needed for indigestion or heartburn.      . clindamycin (CLEOCIN) 150 MG capsule Take 2 capsules (300 mg total) by mouth 4 (four) times daily. May dispense as 150mg  capsules  56 capsule  0  . Prenatal Vit-Fe Fumarate-FA (PRENATAL MULTIVITAMIN) TABS tablet Take 1 tablet by mouth daily at 12 noon.      . valACYclovir (VALTREX) 1000 MG tablet Take 1 tablet (1,000 mg total) by mouth 2 (two) times daily.  21 tablet  0    Review of Systems  Constitutional: Negative for fever, chills and malaise/fatigue.  Gastrointestinal: Positive for abdominal pain (cramping). Negative for nausea and vomiting.  Genitourinary: Negative for dysuria.       Spotting once  Neurological: Negative for dizziness.   Physical Exam   Blood pressure 127/76, pulse 84, temperature 98.6 F (37 C), temperature source Oral, resp. rate  18, height 5\' 2"  (1.575 m), weight 88.905 kg (196 lb), last menstrual period 06/14/2013.  Physical Exam  Constitutional: She is oriented to person, place, and time. She appears well-developed and well-nourished. No distress.  Cardiovascular: Normal rate.   Respiratory: Effort normal.  GI: Soft. She exhibits no distension. There is no tenderness. There is no rebound and no guarding.  Genitourinary: Vagina normal and uterus normal. No vaginal discharge (NO blood in vault) found.  Musculoskeletal: Normal range of motion.  Neurological: She is alert and oriented to person, place, and time.  Skin: Skin is warm and dry.  Psychiatric: She has a normal mood and affect.   FHR reactive UCs every 1.5-3 minutes, mild  Cervix closed/50%/-3/vertex  MAU Course  Procedures  MDM Dr Mora ApplPinn consulted.  Does not want to give Tocolytics at this point, Wants to watch for 2-3 hours and recheck cervix. >>>>  Recheck of cervix showed no change. Still contracting every 4-5 min, mild. Pt not having to breathe through them. States are mild.  D/W Dr Mora ApplPinn. WIll D/C home with labor precautions.  Assessment and Plan  A:  SIUP at 3267w4d       Preterm contractions with some effacement but no change over time  P:  Discharge home       Labor precautions.        Pt to call office and schedule next visit sooner than it is scheduled now.  [facility], IncWILLIAMS,Tomeshia Pizzi 02/04/2014, 10:34 AM

## 2014-02-04 NOTE — MAU Provider Note (Signed)
Reviewed case with CNM. I agree with above patient given PTL precautions and will follow up in office this week or next   [facility], [facility] LLCWALDA STACIA

## 2014-02-04 NOTE — MAU Note (Signed)
Had a [place] spotting this morning.  Denies hx of previa or low lying placenta; no recent intercourse.

## 2014-02-20 ENCOUNTER — Other Ambulatory Visit: Payer: Self-pay

## 2014-02-26 ENCOUNTER — Ambulatory Visit (INDEPENDENT_AMBULATORY_CARE_PROVIDER_SITE_OTHER): Payer: Self-pay | Admitting: Pediatrics

## 2014-02-26 DIAGNOSIS — Z7681 Expectant parent(s) prebirth pediatrician visit: Secondary | ICD-10-CM | POA: Insufficient documentation

## 2014-02-26 NOTE — Progress Notes (Signed)
Prenatal counseling for impending newborn done  

## 2014-02-28 ENCOUNTER — Encounter (HOSPITAL_COMMUNITY): Payer: Self-pay | Admitting: *Deleted

## 2014-02-28 ENCOUNTER — Inpatient Hospital Stay (HOSPITAL_COMMUNITY)
Admission: AD | Admit: 2014-02-28 | Discharge: 2014-02-28 | Disposition: A | Discharge status: Home / Self Care | Payer: BC Managed Care – PPO | Source: Ambulatory Visit | Attending: Rheumatology | Admitting: Rheumatology

## 2014-02-28 DIAGNOSIS — O99891 Other specified diseases and conditions complicating pregnancy: Secondary | ICD-10-CM | POA: Insufficient documentation

## 2014-02-28 DIAGNOSIS — O9989 Other specified diseases and conditions complicating pregnancy, childbirth and the puerperium: Principal | ICD-10-CM

## 2014-02-28 LAB — AMNISURE RUPTURE OF MEMBRANE (ROM) NOT AT ARMC: AMNISURE: NEGATIVE

## 2014-02-28 NOTE — MAU Note (Signed)
States some she has felt damp since Tuesday. States she feels constantly wet but not "dripping." Pt not wearing a pad. Denies vaginal bleeding. Denies contractions. +FM.

## 2014-02-28 NOTE — MAU Note (Signed)
Pt states she  Noticed leaking about 1200 and again about 1900

## 2014-02-28 NOTE — Discharge Instructions (Signed)
Third Trimester of Pregnancy °The third trimester is from week 29 through week 42, months 7 through 9. The third trimester is a time when the fetus is growing rapidly. At the end of the ninth month, the fetus is about 20 inches in length and weighs 6-10 pounds.  °BODY CHANGES °Your body goes through many changes during pregnancy. The changes vary from woman to woman.  °· Your weight will continue to increase. You can expect to gain 25-35 pounds (11-16 kg) by the end of the pregnancy. °· You may begin to get stretch marks on your hips, abdomen, and breasts. °· You may urinate more often because the fetus is moving lower into your pelvis and pressing on your bladder. °· You may develop or continue to have heartburn as a result of your pregnancy. °· You may develop constipation because certain hormones are causing the muscles that push waste through your intestines to slow down. °· You may develop hemorrhoids or swollen, bulging veins (varicose veins). °· You may have pelvic pain because of the weight gain and pregnancy hormones relaxing your joints between the bones in your pelvis. Backaches may result from overexertion of the muscles supporting your posture. °· You may have changes in your hair. These can include thickening of your hair, rapid growth, and changes in texture. Some women also have hair loss during or after pregnancy, or hair that feels dry or thin. Your hair will most likely return to normal after your baby is born. °· Your breasts will continue to grow and be tender. A yellow discharge may leak from your breasts called colostrum. °· Your belly button may stick out. °· You may feel short of breath because of your expanding uterus. °· You may notice the fetus "dropping," or moving lower in your abdomen. °· You may have a bloody mucus discharge. This usually occurs a few days to a week before labor begins. °· Your cervix becomes thin and soft (effaced) near your due date. °WHAT TO EXPECT AT YOUR PRENATAL  EXAMS  °You will have prenatal exams every 2 weeks until week 36. Then, you will have weekly prenatal exams. During a routine prenatal visit: °· You will be weighed to make sure you and the fetus are growing normally. °· Your blood pressure is taken. °· Your abdomen will be measured to track your baby's growth. °· The fetal heartbeat will be listened to. °· Any test results from the previous visit will be discussed. °· You may have a cervical check near your due date to see if you have effaced. °At around 36 weeks, your caregiver will check your cervix. At the same time, your caregiver will also perform a test on the secretions of the vaginal tissue. This test is to determine if a type of bacteria, Group B streptococcus, is present. Your caregiver will explain this further. °Your caregiver may ask you: °· What your birth plan is. °· How you are feeling. °· If you are feeling the baby move. °· If you have had any abnormal symptoms, such as leaking fluid, bleeding, severe headaches, or abdominal cramping. °· If you have any questions. °Other tests or screenings that may be performed during your third trimester include: °· Blood tests that check for low iron levels (anemia). °· Fetal testing to check the health, activity level, and growth of the fetus. Testing is done if you have certain medical conditions or if there are problems during the pregnancy. °FALSE LABOR °You may feel small, irregular contractions that   eventually go away. These are called Braxton Hicks contractions, or false labor. Contractions may last for hours, days, or even weeks before true labor sets in. If contractions come at regular intervals, intensify, or become painful, it is best to be seen by your caregiver.  °SIGNS OF LABOR  °· Menstrual-like cramps. °· Contractions that are 5 minutes apart or less. °· Contractions that start on the top of the uterus and spread down to the lower abdomen and back. °· A sense of increased pelvic pressure or back  pain. °· A watery or bloody mucus discharge that comes from the vagina. °If you have any of these signs before the 37th week of pregnancy, call your caregiver right away. You need to go to the hospital to get checked immediately. °HOME CARE INSTRUCTIONS  °· Avoid all smoking, herbs, alcohol, and unprescribed drugs. These chemicals affect the formation and growth of the baby. °· Follow your caregiver's instructions regarding medicine use. There are medicines that are either safe or unsafe to take during pregnancy. °· Exercise only as directed by your caregiver. Experiencing uterine cramps is a good sign to stop exercising. °· Continue to eat regular, healthy meals. °· Wear a good support bra for breast tenderness. °· Do not use hot tubs, steam rooms, or saunas. °· Wear your seat belt at all times when driving. °· Avoid raw meat, uncooked cheese, cat litter boxes, and soil used by cats. These carry germs that can cause birth defects in the baby. °· Take your prenatal vitamins. °· Try taking a stool softener (if your caregiver approves) if you develop constipation. Eat more high-fiber foods, such as fresh vegetables or fruit and whole grains. Drink plenty of fluids to keep your urine clear or pale yellow. °· Take warm sitz baths to soothe any pain or discomfort caused by hemorrhoids. Use hemorrhoid cream if your caregiver approves. °· If you develop varicose veins, wear support hose. Elevate your feet for 15 minutes, 3-4 times a day. Limit salt in your diet. °· Avoid heavy lifting, wear low heal shoes, and practice good posture. °· Rest a lot with your legs elevated if you have leg cramps or low back pain. °· Visit your dentist if you have not gone during your pregnancy. Use a soft toothbrush to brush your teeth and be gentle when you floss. °· A sexual relationship may be continued unless your caregiver directs you otherwise. °· Do not travel far distances unless it is absolutely necessary and only with the approval  of your caregiver. °· Take prenatal classes to understand, practice, and ask questions about the labor and delivery. °· Make a trial run to the hospital. °· Pack your hospital bag. °· Prepare the baby's nursery. °· Continue to go to all your prenatal visits as directed by your caregiver. °SEEK MEDICAL CARE IF: °· You are unsure if you are in labor or if your water has broken. °· You have dizziness. °· You have mild pelvic cramps, pelvic pressure, or nagging pain in your abdominal area. °· You have persistent nausea, vomiting, or diarrhea. °· You have a bad smelling vaginal discharge. °· You have pain with urination. °SEEK IMMEDIATE MEDICAL CARE IF:  °· You have a fever. °· You are leaking fluid from your vagina. °· You have spotting or bleeding from your vagina. °· You have severe abdominal cramping or pain. °· You have rapid weight loss or gain. °· You have shortness of breath with chest pain. °· You notice sudden or extreme swelling   of your face, hands, ankles, feet, or legs. °· You have not felt your baby move in over an hour. °· You have severe headaches that do not go away with medicine. °· You have vision changes. °Document Released: 08/03/2001 Document Revised: 08/14/2013 Document Reviewed: 10/10/2012 °[employer]® Patient Information ©2015 [employer]. This information is not intended to replace advice given to you by your health care provider. Make sure you discuss any questions you have with your health care provider. °Fetal Movement Counts °Patient Name: __________________________________________________ Patient Due Date: ____________________ °Performing a fetal movement count is highly recommended in high-risk pregnancies, but it is good for every pregnant woman to do. Your caregiver may ask you to start counting fetal movements at 28 weeks of the pregnancy. Fetal movements often increase: °· After eating a full meal. °· After physical activity. °· After eating or drinking something sweet or cold. °· At  rest. °Pay attention to when you feel the baby is most active. This will help you notice a pattern of your baby's sleep and wake cycles and what factors contribute to an increase in fetal movement. It is important to perform a fetal movement count at the same time each day when your baby is normally most active.  °HOW TO COUNT FETAL MOVEMENTS °1. Find a quiet and comfortable area to sit or lie down on your left side. Lying on your left side provides the best blood and oxygen circulation to your baby. °2. Write down the day and time on a sheet of paper or in a journal. °3. Start counting kicks, flutters, swishes, rolls, or jabs in a 2 hour period. You should feel at least 10 movements within 2 hours. °4. If you do not feel 10 movements in 2 hours, wait 2-3 hours and count again. Look for a change in the pattern or not enough counts in 2 hours. °SEEK MEDICAL CARE IF: °· You feel less than 10 counts in 2 hours, tried twice. °· There is no movement in over an hour. °· The pattern is changing or taking longer each day to reach 10 counts in 2 hours. °· You feel the baby is not moving as he or she usually does. °Date: ____________ Movements: ____________ Start time: ____________ Finish time: ____________  °Date: ____________ Movements: ____________ Start time: ____________ Finish time: ____________ °Date: ____________ Movements: ____________ Start time: ____________ Finish time: ____________ °Date: ____________ Movements: ____________ Start time: ____________ Finish time: ____________ °Date: ____________ Movements: ____________ Start time: ____________ Finish time: ____________ °Date: ____________ Movements: ____________ Start time: ____________ Finish time: ____________ °Date: ____________ Movements: ____________ Start time: ____________ Finish time: ____________ °Date: ____________ Movements: ____________ Start time: ____________ Finish time: ____________  °Date: ____________ Movements: ____________ Start time:  ____________ Finish time: ____________ °Date: ____________ Movements: ____________ Start time: ____________ Finish time: ____________ °Date: ____________ Movements: ____________ Start time: ____________ Finish time: ____________ °Date: ____________ Movements: ____________ Start time: ____________ Finish time: ____________ °Date: ____________ Movements: ____________ Start time: ____________ Finish time: ____________ °Date: ____________ Movements: ____________ Start time: ____________ Finish time: ____________ °Date: ____________ Movements: ____________ Start time: ____________ Finish time: ____________  °Date: ____________ Movements: ____________ Start time: ____________ Finish time: ____________ °Date: ____________ Movements: ____________ Start time: ____________ Finish time: ____________ °Date: ____________ Movements: ____________ Start time: ____________ Finish time: ____________ °Date: ____________ Movements: ____________ Start time: ____________ Finish time: ____________ °Date: ____________ Movements: ____________ Start time: ____________ Finish time: ____________ °Date: ____________ Movements: ____________ Start time: ____________ Finish time: ____________ °Date: ____________ Movements: ____________ Start time: ____________ Finish time: ____________  °  Date: ____________ Movements: ____________ Start time: ____________ Finish time: ____________ °Date: ____________ Movements: ____________ Start time: ____________ Finish time: ____________ °Date: ____________ Movements: ____________ Start time: ____________ Finish time: ____________ °Date: ____________ Movements: ____________ Start time: ____________ Finish time: ____________ °Date: ____________ Movements: ____________ Start time: ____________ Finish time: ____________ °Date: ____________ Movements: ____________ Start time: ____________ Finish time: ____________ °Date: ____________ Movements: ____________ Start time: ____________ Finish time: ____________  °Date:  ____________ Movements: ____________ Start time: ____________ Finish time: ____________ °Date: ____________ Movements: ____________ Start time: ____________ Finish time: ____________ °Date: ____________ Movements: ____________ Start time: ____________ Finish time: ____________ °Date: ____________ Movements: ____________ Start time: ____________ Finish time: ____________ °Date: ____________ Movements: ____________ Start time: ____________ Finish time: ____________ °Date: ____________ Movements: ____________ Start time: ____________ Finish time: ____________ °Date: ____________ Movements: ____________ Start time: ____________ Finish time: ____________  °Date: ____________ Movements: ____________ Start time: ____________ Finish time: ____________ °Date: ____________ Movements: ____________ Start time: ____________ Finish time: ____________ °Date: ____________ Movements: ____________ Start time: ____________ Finish time: ____________ °Date: ____________ Movements: ____________ Start time: ____________ Finish time: ____________ °Date: ____________ Movements: ____________ Start time: ____________ Finish time: ____________ °Date: ____________ Movements: ____________ Start time: ____________ Finish time: ____________ °Date: ____________ Movements: ____________ Start time: ____________ Finish time: ____________  °Date: ____________ Movements: ____________ Start time: ____________ Finish time: ____________ °Date: ____________ Movements: ____________ Start time: ____________ Finish time: ____________ °Date: ____________ Movements: ____________ Start time: ____________ Finish time: ____________ °Date: ____________ Movements: ____________ Start time: ____________ Finish time: ____________ °Date: ____________ Movements: ____________ Start time: ____________ Finish time: ____________ °Date: ____________ Movements: ____________ Start time: ____________ Finish time: ____________ °Date: ____________ Movements: ____________ Start  time: ____________ Finish time: ____________  °Date: ____________ Movements: ____________ Start time: ____________ Finish time: ____________ °Date: ____________ Movements: ____________ Start time: ____________ Finish time: ____________ °Date: ____________ Movements: ____________ Start time: ____________ Finish time: ____________ °Date: ____________ Movements: ____________ Start time: ____________ Finish time: ____________ °Date: ____________ Movements: ____________ Start time: ____________ Finish time: ____________ °Date: ____________ Movements: ____________ Start time: ____________ Finish time: ____________ °Document Released: 09/08/2006 Document Revised: 07/26/2012 Document Reviewed: 06/05/2012 °[employer]® Patient Information ©2015 [employer]. This information is not intended to replace advice given to you by your health care provider. Make sure you discuss any questions you have with your health care provider. ° °

## 2014-03-03 ENCOUNTER — Encounter (HOSPITAL_COMMUNITY): Payer: Self-pay | Admitting: *Deleted

## 2014-03-03 ENCOUNTER — Inpatient Hospital Stay (HOSPITAL_COMMUNITY)
Admission: AD | Admit: 2014-03-03 | Discharge: 2014-03-03 | Disposition: A | Discharge status: Home / Self Care | Payer: BC Managed Care – PPO | Source: Ambulatory Visit | Attending: Obstetrics & Gynecology | Admitting: Obstetrics & Gynecology

## 2014-03-03 DIAGNOSIS — O479 False labor, unspecified: Secondary | ICD-10-CM | POA: Diagnosis present

## 2014-03-03 NOTE — Discharge Instructions (Signed)
Fetal Movement Counts °Patient Name: __________________________________________________ Patient Due Date: ____________________ °Performing a fetal movement count is highly recommended in high-risk pregnancies, but it is good for every pregnant woman to do. Your caregiver may ask you to start counting fetal movements at 28 weeks of the pregnancy. Fetal movements often increase: °· After eating a full meal. °· After physical activity. °· After eating or drinking something sweet or cold. °· At rest. °Pay attention to when you feel the baby is most active. This will help you notice a pattern of your baby's sleep and wake cycles and what factors contribute to an increase in fetal movement. It is important to perform a fetal movement count at the same time each day when your baby is normally most active.  °HOW TO COUNT FETAL MOVEMENTS °1. Find a quiet and comfortable area to sit or lie down on your left side. Lying on your left side provides the best blood and oxygen circulation to your baby. °2. Write down the day and time on a sheet of paper or in a journal. °3. Start counting kicks, flutters, swishes, rolls, or jabs in a 2 hour period. You should feel at least 10 movements within 2 hours. °4. If you do not feel 10 movements in 2 hours, wait 2-3 hours and count again. Look for a change in the pattern or not enough counts in 2 hours. °SEEK MEDICAL CARE IF: °· You feel less than 10 counts in 2 hours, tried twice. °· There is no movement in over an hour. °· The pattern is changing or taking longer each day to reach 10 counts in 2 hours. °· You feel the baby is not moving as he or she usually does. °Date: ____________ Movements: ____________ Start time: ____________ Finish time: ____________  °Date: ____________ Movements: ____________ Start time: ____________ Finish time: ____________ °Date: ____________ Movements: ____________ Start time: ____________ Finish time: ____________ °Date: ____________ Movements: ____________  Start time: ____________ Finish time: ____________ °Date: ____________ Movements: ____________ Start time: ____________ Finish time: ____________ °Date: ____________ Movements: ____________ Start time: ____________ Finish time: ____________ °Date: ____________ Movements: ____________ Start time: ____________ Finish time: ____________ °Date: ____________ Movements: ____________ Start time: ____________ Finish time: ____________  °Date: ____________ Movements: ____________ Start time: ____________ Finish time: ____________ °Date: ____________ Movements: ____________ Start time: ____________ Finish time: ____________ °Date: ____________ Movements: ____________ Start time: ____________ Finish time: ____________ °Date: ____________ Movements: ____________ Start time: ____________ Finish time: ____________ °Date: ____________ Movements: ____________ Start time: ____________ Finish time: ____________ °Date: ____________ Movements: ____________ Start time: ____________ Finish time: ____________ °Date: ____________ Movements: ____________ Start time: ____________ Finish time: ____________  °Date: ____________ Movements: ____________ Start time: ____________ Finish time: ____________ °Date: ____________ Movements: ____________ Start time: ____________ Finish time: ____________ °Date: ____________ Movements: ____________ Start time: ____________ Finish time: ____________ °Date: ____________ Movements: ____________ Start time: ____________ Finish time: ____________ °Date: ____________ Movements: ____________ Start time: ____________ Finish time: ____________ °Date: ____________ Movements: ____________ Start time: ____________ Finish time: ____________ °Date: ____________ Movements: ____________ Start time: ____________ Finish time: ____________  °Date: ____________ Movements: ____________ Start time: ____________ Finish time: ____________ °Date: ____________ Movements: ____________ Start time: ____________ Finish time:  ____________ °Date: ____________ Movements: ____________ Start time: ____________ Finish time: ____________ °Date: ____________ Movements: ____________ Start time: ____________ Finish time: ____________ °Date: ____________ Movements: ____________ Start time: ____________ Finish time: ____________ °Date: ____________ Movements: ____________ Start time: ____________ Finish time: ____________ °Date: ____________ Movements: ____________ Start time: ____________ Finish time: ____________  °Date: ____________ Movements: ____________ Start time: ____________ Finish time:   ____________ °Date: ____________ Movements: ____________ Start time: ____________ Finish time: ____________ °Date: ____________ Movements: ____________ Start time: ____________ Finish time: ____________ °Date: ____________ Movements: ____________ Start time: ____________ Finish time: ____________ °Date: ____________ Movements: ____________ Start time: ____________ Finish time: ____________ °Date: ____________ Movements: ____________ Start time: ____________ Finish time: ____________ °Date: ____________ Movements: ____________ Start time: ____________ Finish time: ____________  °Date: ____________ Movements: ____________ Start time: ____________ Finish time: ____________ °Date: ____________ Movements: ____________ Start time: ____________ Finish time: ____________ °Date: ____________ Movements: ____________ Start time: ____________ Finish time: ____________ °Date: ____________ Movements: ____________ Start time: ____________ Finish time: ____________ °Date: ____________ Movements: ____________ Start time: ____________ Finish time: ____________ °Date: ____________ Movements: ____________ Start time: ____________ Finish time: ____________ °Date: ____________ Movements: ____________ Start time: ____________ Finish time: ____________  °Date: ____________ Movements: ____________ Start time: ____________ Finish time: ____________ °Date: ____________ Movements:  ____________ Start time: ____________ Finish time: ____________ °Date: ____________ Movements: ____________ Start time: ____________ Finish time: ____________ °Date: ____________ Movements: ____________ Start time: ____________ Finish time: ____________ °Date: ____________ Movements: ____________ Start time: ____________ Finish time: ____________ °Date: ____________ Movements: ____________ Start time: ____________ Finish time: ____________ °Date: ____________ Movements: ____________ Start time: ____________ Finish time: ____________  °Date: ____________ Movements: ____________ Start time: ____________ Finish time: ____________ °Date: ____________ Movements: ____________ Start time: ____________ Finish time: ____________ °Date: ____________ Movements: ____________ Start time: ____________ Finish time: ____________ °Date: ____________ Movements: ____________ Start time: ____________ Finish time: ____________ °Date: ____________ Movements: ____________ Start time: ____________ Finish time: ____________ °Date: ____________ Movements: ____________ Start time: ____________ Finish time: ____________ °Document Released: 09/08/2006 Document Revised: 07/26/2012 Document Reviewed: 06/05/2012 °Exit[employer]® Patient Information ©2015 [employer]. This information is not intended to replace advice given to you by your health care provider. Make sure you discuss any questions you have with your health care provider. °Natural Childbirth °Natural childbirth is going through labor and delivery without any drugs to relieve pain. You also do not use fetal monitors, have a cesarean delivery, or get a sugical cut to enlarge the vaginal opening (episiotomy). With the help of a birthing professional (midwife), you will direct your own labor and delivery as you choose. °Many women chose natural childbirth because they feel more in control and in touch with their labor and delivery. They are also concerned about the medications affecting  themselves and the baby. °Pregnant women with a high risk pregnancy should not attempt natural childbirth. It is better to deliver the infant in a hospital if an emergency situation arises. Sometimes, the caregiver has to intervene for the health and safety of the mother and infant. °TWO TECHNIQUES FOR NATURAL CHILDBIRTH:  °· The Lamaze method. This method teaches women that having a baby is normal, healthy, and natural. It also teaches the mother to take a neutral position regarding pain medication and anesthesia and to make an informed decision if and when it is right for them. °· The Bradley method (also called husband coached birth). This method teaches the father to be the birth coach and stresses a natural approach. It also encourages exercise and a balanced diet with good nutrition. The exercises teach relaxation and deep breathing techniques. However, there are also classes to prepare the parents for an emergency situation that may occur. °METHODS OF DEALING WITH LABOR PAIN AND DELIVERY: °· Meditation. °· Yoga. °· Hypnosis. °· Acupuncture. °· Massage. °· Changing positions (walking, rocking, showering, leaning on birth balls). °· Lying in warm water or a jacuzzi. °· Find an activity that keeps your mind off of the labor   pain. °· Listen to soft music. °· Visual imagery (focus on a particular object). °BEFORE GOING INTO LABOR °· Be sure you and your spouse/partner are in agreement to have natural childbirth. °· Decide if your caregiver or a midwife will deliver your baby. °· Decide if you will have your baby in the hospital, birthing center, or at home. °· If you have children, make plans to have someone to take care of them when you go to the hospital. °· Know the distance and the time it takes to go to the delivery center. Make a dry run to be sure. °· Have a bag packed with a night gown, bathrobe, and toiletries ready to take when you go into labor. °· Keep phone numbers of your family and friends handy if  you need to call someone when you go into labor. °· Your spouse or partner should go to all the teaching classes. °· Talk with your caregiver about the possibility of a medical emergency and what will happen if that occurs. °ADVANTAGES OF NATURAL CHILDBIRTH °· You are in control of your labor and delivery. °· It is safe. °· There are no medications or anesthetics that may affect you and the fetus. °· There are no invasive procedures such as an episiotomy. °· You and your partner will work together, which can increase your bond. °· Meditation, yoga, massage, and breathing exercises can be learned while pregnant and help you when you are in labor and at delivery. °· In most delivery centers, the family and friends can be involved in the labor and delivery process. °DISADVANTAGES OF NATURAL CHILDBIRTH °· You will experience pain during your labor and delivery. °· The methods of helping relieve your labor pains may not work for you. °· You may feel embarrassed, disappointed, and like a failure if you decide to change your mind during labor and not have natural childbirth. °AFTER THE DELIVERY °· You will be very tired. °· You will be uncomfortable because of your uterus contracting. You will feel soreness around the vagina. °· You may feel cold and shaky.This is a natural reaction. °· You will be excited, overwhelmed, accomplished, and proud to be a mother. °HOME CARE INSTRUCTIONS  °· Follow the advice and instructions of your caregiver. °· Follow the instructions of your natural childbirth instructor (Lamaze or Bradley Method). °Document Released: 07/22/2008 Document Revised: 11/01/2011 Document Reviewed: 04/16/2013 °ExitCare® Patient Information ©2015 [employer]. This information is not intended to replace advice given to you by your health care provider. Make sure you discuss any questions you have with your health care provider. ° °

## 2014-03-03 NOTE — MAU Note (Signed)
Has been contracting off and since yesterday.  Started back this morning, now every 5min. Was 1 cm last Mon. No problems with preg.

## 2014-03-03 NOTE — Progress Notes (Signed)
Dr Mora ApplPinn on unit and updated on pt's FHR, contraction pattern, and VE orders received to discharge home

## 2014-03-10 ENCOUNTER — Inpatient Hospital Stay (HOSPITAL_COMMUNITY)
Admission: AD | Admit: 2014-03-10 | Discharge: 2014-03-13 | DRG: 774 | Disposition: A | Discharge status: Home / Self Care | Payer: BC Managed Care – PPO | Source: Ambulatory Visit | Attending: Obstetrics and Gynecology | Admitting: Obstetrics and Gynecology

## 2014-03-10 DIAGNOSIS — A6000 Herpesviral infection of urogenital system, unspecified: Secondary | ICD-10-CM | POA: Diagnosis present

## 2014-03-10 DIAGNOSIS — IMO0001 Reserved for inherently not codable concepts without codable children: Secondary | ICD-10-CM

## 2014-03-10 DIAGNOSIS — O99214 Obesity complicating childbirth: Secondary | ICD-10-CM

## 2014-03-10 DIAGNOSIS — Z6841 Body Mass Index (BMI) 40.0 and over, adult: Secondary | ICD-10-CM

## 2014-03-10 DIAGNOSIS — E669 Obesity, unspecified: Secondary | ICD-10-CM | POA: Diagnosis present

## 2014-03-10 DIAGNOSIS — O9989 Other specified diseases and conditions complicating pregnancy, childbirth and the puerperium: Secondary | ICD-10-CM

## 2014-03-10 DIAGNOSIS — Z22330 Carrier of Group B streptococcus: Secondary | ICD-10-CM

## 2014-03-10 DIAGNOSIS — O99892 Other specified diseases and conditions complicating childbirth: Secondary | ICD-10-CM | POA: Diagnosis present

## 2014-03-10 DIAGNOSIS — O98519 Other viral diseases complicating pregnancy, unspecified trimester: Secondary | ICD-10-CM | POA: Diagnosis present

## 2014-03-10 NOTE — MAU Note (Signed)
Pt G1P0 at 38w 3d with c/o SROM, clear fluid at 2315 tonight. Good fetal movement, no bleeding or contractions.

## 2014-03-11 ENCOUNTER — Inpatient Hospital Stay (HOSPITAL_COMMUNITY): Payer: BC Managed Care – PPO | Admitting: Anesthesiology

## 2014-03-11 ENCOUNTER — Encounter (HOSPITAL_COMMUNITY): Payer: Self-pay | Admitting: *Deleted

## 2014-03-11 ENCOUNTER — Encounter (HOSPITAL_COMMUNITY): Payer: BC Managed Care – PPO | Admitting: Anesthesiology

## 2014-03-11 DIAGNOSIS — Z22330 Carrier of Group B streptococcus: Secondary | ICD-10-CM | POA: Diagnosis not present

## 2014-03-11 DIAGNOSIS — E669 Obesity, unspecified: Secondary | ICD-10-CM | POA: Diagnosis present

## 2014-03-11 DIAGNOSIS — A6000 Herpesviral infection of urogenital system, unspecified: Secondary | ICD-10-CM | POA: Diagnosis present

## 2014-03-11 DIAGNOSIS — O99891 Other specified diseases and conditions complicating pregnancy: Secondary | ICD-10-CM | POA: Diagnosis present

## 2014-03-11 DIAGNOSIS — IMO0001 Reserved for inherently not codable concepts without codable children: Secondary | ICD-10-CM

## 2014-03-11 DIAGNOSIS — Z6841 Body Mass Index (BMI) 40.0 and over, adult: Secondary | ICD-10-CM | POA: Diagnosis not present

## 2014-03-11 DIAGNOSIS — O99214 Obesity complicating childbirth: Secondary | ICD-10-CM | POA: Diagnosis present

## 2014-03-11 DIAGNOSIS — O98519 Other viral diseases complicating pregnancy, unspecified trimester: Secondary | ICD-10-CM | POA: Diagnosis present

## 2014-03-11 DIAGNOSIS — O99892 Other specified diseases and conditions complicating childbirth: Secondary | ICD-10-CM | POA: Diagnosis present

## 2014-03-11 LAB — TYPE AND SCREEN
ABO/RH(D): O POS
Antibody Screen: NEGATIVE

## 2014-03-11 LAB — OB RESULTS CONSOLE RPR: RPR: NONREACTIVE

## 2014-03-11 LAB — OB RESULTS CONSOLE RUBELLA ANTIBODY, IGM: RUBELLA: IMMUNE

## 2014-03-11 LAB — CBC
HCT: 38.8 % (ref 36.0–46.0)
HEMOGLOBIN: 12.7 g/dL (ref 12.0–15.0)
MCH: 28.3 pg (ref 26.0–34.0)
MCHC: 32.7 g/dL (ref 30.0–36.0)
MCV: 86.6 fL (ref 78.0–100.0)
Platelets: 162 10*3/uL (ref 150–400)
RBC: 4.48 MIL/uL (ref 3.87–5.11)
RDW: 13.2 % (ref 11.5–15.5)
WBC: 9 10*3/uL (ref 4.0–10.5)

## 2014-03-11 LAB — RPR

## 2014-03-11 LAB — POCT FERN TEST: POCT FERN TEST: POSITIVE

## 2014-03-11 LAB — OB RESULTS CONSOLE GBS: GBS: POSITIVE

## 2014-03-11 LAB — OB RESULTS CONSOLE GC/CHLAMYDIA
CHLAMYDIA, DNA PROBE: NEGATIVE
Gonorrhea: NEGATIVE

## 2014-03-11 LAB — OB RESULTS CONSOLE HIV ANTIBODY (ROUTINE TESTING): HIV: NONREACTIVE

## 2014-03-11 LAB — OB RESULTS CONSOLE HEPATITIS B SURFACE ANTIGEN: HEP B S AG: NEGATIVE

## 2014-03-11 MED ORDER — DEXTROSE 5 % IV SOLN
5.0000 10*6.[IU] | Freq: Once | INTRAVENOUS | Status: AC
  Administered 2014-03-11: 5 10*6.[IU] via INTRAVENOUS
  Filled 2014-03-11: qty 5

## 2014-03-11 MED ORDER — BUTORPHANOL TARTRATE 1 MG/ML IJ SOLN
1.0000 mg | INTRAMUSCULAR | Status: DC | PRN
  Administered 2014-03-11 (×2): 1 mg via INTRAVENOUS
  Filled 2014-03-11 (×2): qty 1

## 2014-03-11 MED ORDER — LIDOCAINE HCL (PF) 1 % IJ SOLN
30.0000 mL | INTRAMUSCULAR | Status: DC | PRN
  Administered 2014-03-11: 30 mL via SUBCUTANEOUS
  Filled 2014-03-11: qty 30

## 2014-03-11 MED ORDER — FLEET ENEMA 7-19 GM/118ML RE ENEM: 1.0000 | ENEMA | RECTAL | Status: DC | PRN

## 2014-03-11 MED ORDER — IBUPROFEN 600 MG PO TABS: 600.0000 mg | ORAL_TABLET | Freq: Four times a day (QID) | ORAL | Status: DC | PRN

## 2014-03-11 MED ORDER — BENZOCAINE-MENTHOL 20-0.5 % EX AERO: 1.0000 "application " | INHALATION_SPRAY | CUTANEOUS | Status: DC | PRN

## 2014-03-11 MED ORDER — OXYTOCIN BOLUS FROM INFUSION: 500.0000 mL | INTRAVENOUS | Status: DC

## 2014-03-11 MED ORDER — LACTATED RINGERS IV SOLN
INTRAVENOUS | Status: DC
  Administered 2014-03-11: 01:00:00 via INTRAVENOUS

## 2014-03-11 MED ORDER — EPHEDRINE 5 MG/ML INJ
10.0000 mg | INTRAVENOUS | Status: DC | PRN
  Filled 2014-03-11: qty 2

## 2014-03-11 MED ORDER — CITRIC ACID-SODIUM CITRATE 334-500 MG/5ML PO SOLN: 30.0000 mL | ORAL | Status: DC | PRN

## 2014-03-11 MED ORDER — PHENYLEPHRINE 40 MCG/ML (10ML) SYRINGE FOR IV PUSH (FOR BLOOD PRESSURE SUPPORT)
80.0000 ug | PREFILLED_SYRINGE | INTRAVENOUS | Status: DC | PRN
  Filled 2014-03-11: qty 2
  Filled 2014-03-11: qty 10

## 2014-03-11 MED ORDER — SENNOSIDES-DOCUSATE SODIUM 8.6-50 MG PO TABS
2.0000 | ORAL_TABLET | ORAL | Status: DC
  Administered 2014-03-11 – 2014-03-12 (×2): 2 via ORAL
  Filled 2014-03-11 (×2): qty 2

## 2014-03-11 MED ORDER — TERBUTALINE SULFATE 1 MG/ML IJ SOLN: 0.2500 mg | Freq: Once | INTRAMUSCULAR | Status: DC | PRN

## 2014-03-11 MED ORDER — OXYTOCIN 40 UNITS IN LACTATED RINGERS INFUSION - SIMPLE MED: 62.5000 mL/h | INTRAVENOUS | Status: DC

## 2014-03-11 MED ORDER — PHENYLEPHRINE 40 MCG/ML (10ML) SYRINGE FOR IV PUSH (FOR BLOOD PRESSURE SUPPORT)
80.0000 ug | PREFILLED_SYRINGE | INTRAVENOUS | Status: DC | PRN
  Filled 2014-03-11: qty 2

## 2014-03-11 MED ORDER — IBUPROFEN 600 MG PO TABS
600.0000 mg | ORAL_TABLET | Freq: Four times a day (QID) | ORAL | Status: DC
  Administered 2014-03-11 – 2014-03-13 (×6): 600 mg via ORAL
  Filled 2014-03-11 (×7): qty 1

## 2014-03-11 MED ORDER — ONDANSETRON HCL 4 MG/2ML IJ SOLN: 4.0000 mg | Freq: Four times a day (QID) | INTRAMUSCULAR | Status: DC | PRN

## 2014-03-11 MED ORDER — PENICILLIN G POTASSIUM 5000000 UNITS IJ SOLR
2.5000 10*6.[IU] | INTRAVENOUS | Status: DC
  Administered 2014-03-11: 2.5 10*6.[IU] via INTRAVENOUS
  Filled 2014-03-11 (×6): qty 2.5

## 2014-03-11 MED ORDER — LACTATED RINGERS IV SOLN: 500.0000 mL | INTRAVENOUS | Status: DC | PRN

## 2014-03-11 MED ORDER — OXYTOCIN 40 UNITS IN LACTATED RINGERS INFUSION - SIMPLE MED
1.0000 m[IU]/min | INTRAVENOUS | Status: DC
  Administered 2014-03-11: 2 m[IU]/min via INTRAVENOUS
  Filled 2014-03-11: qty 1000

## 2014-03-11 MED ORDER — ZOLPIDEM TARTRATE 5 MG PO TABS: 5.0000 mg | ORAL_TABLET | Freq: Every evening | ORAL | Status: DC | PRN

## 2014-03-11 MED ORDER — LANOLIN HYDROUS EX OINT: TOPICAL_OINTMENT | CUTANEOUS | Status: DC | PRN

## 2014-03-11 MED ORDER — OXYCODONE-ACETAMINOPHEN 5-325 MG PO TABS: 1.0000 | ORAL_TABLET | ORAL | Status: DC | PRN

## 2014-03-11 MED ORDER — DIPHENHYDRAMINE HCL 25 MG PO CAPS: 25.0000 mg | ORAL_CAPSULE | Freq: Four times a day (QID) | ORAL | Status: DC | PRN

## 2014-03-11 MED ORDER — DIPHENHYDRAMINE HCL 50 MG/ML IJ SOLN: 12.5000 mg | INTRAMUSCULAR | Status: DC | PRN

## 2014-03-11 MED ORDER — ONDANSETRON HCL 4 MG PO TABS: 4.0000 mg | ORAL_TABLET | ORAL | Status: DC | PRN

## 2014-03-11 MED ORDER — DIBUCAINE 1 % RE OINT: 1.0000 "application " | TOPICAL_OINTMENT | RECTAL | Status: DC | PRN

## 2014-03-11 MED ORDER — FENTANYL 2.5 MCG/ML BUPIVACAINE 1/10 % EPIDURAL INFUSION (WH - ANES)
14.0000 mL/h | INTRAMUSCULAR | Status: DC | PRN
  Administered 2014-03-11 (×2): 14 mL/h via EPIDURAL
  Filled 2014-03-11: qty 125

## 2014-03-11 MED ORDER — LIDOCAINE HCL (PF) 1 % IJ SOLN
INTRAMUSCULAR | Status: DC | PRN
Start: 2014-03-11 — End: 2014-03-15
  Administered 2014-03-11: 10 mL

## 2014-03-11 MED ORDER — LACTATED RINGERS IV SOLN: 500.0000 mL | Freq: Once | INTRAVENOUS | Status: DC

## 2014-03-11 MED ORDER — ONDANSETRON HCL 4 MG/2ML IJ SOLN: 4.0000 mg | INTRAMUSCULAR | Status: DC | PRN

## 2014-03-11 MED ORDER — TETANUS-DIPHTH-ACELL PERTUSSIS 5-2.5-18.5 LF-MCG/0.5 IM SUSP
0.5000 mL | Freq: Once | INTRAMUSCULAR | Status: AC
  Administered 2014-03-12: 0.5 mL via INTRAMUSCULAR
  Filled 2014-03-11: qty 0.5

## 2014-03-11 MED ORDER — DEXTROSE 5 % IV SOLN: 12.0000 10*6.[IU] | Freq: Two times a day (BID) | INTRAVENOUS | Status: DC

## 2014-03-11 MED ORDER — ACETAMINOPHEN 325 MG PO TABS: 650.0000 mg | ORAL_TABLET | ORAL | Status: DC | PRN

## 2014-03-11 MED ORDER — SIMETHICONE 80 MG PO CHEW
80.0000 mg | CHEWABLE_TABLET | ORAL | Status: DC | PRN
Start: 2014-03-11 — End: 2014-03-13

## 2014-03-11 MED ORDER — FENTANYL 2.5 MCG/ML BUPIVACAINE 1/10 % EPIDURAL INFUSION (WH - ANES): 14.0000 mL/h | INTRAMUSCULAR | Status: DC | PRN

## 2014-03-11 MED ORDER — WITCH HAZEL-GLYCERIN EX PADS: 1.0000 "application " | MEDICATED_PAD | CUTANEOUS | Status: DC | PRN

## 2014-03-11 MED ORDER — PRENATAL MULTIVITAMIN CH
1.0000 | ORAL_TABLET | Freq: Every day | ORAL | Status: DC
  Administered 2014-03-12: 1 via ORAL
  Filled 2014-03-11: qty 1

## 2014-03-11 NOTE — Anesthesia Procedure Notes (Signed)
Epidural Patient location during procedure: OB  Preanesthetic Checklist Completed: patient identified, site marked, surgical consent, pre-op evaluation, timeout performed, IV checked, risks and benefits discussed and monitors and equipment checked  Epidural Patient position: sitting Prep: site prepped and draped and DuraPrep Patient monitoring: continuous pulse ox and blood pressure Approach: midline Injection technique: LOR air  Needle:  Needle type: Tuohy  Needle gauge: 17 G Needle length: 9 cm and 9 Needle insertion depth: 7 cm Catheter type: closed end flexible Catheter size: 19 Gauge Catheter at skin depth: 14 cm Test dose: negative  Assessment Events: blood not aspirated, injection not painful, no injection resistance, negative IV test and no paresthesia  Additional Notes Dosing of Epidural:  1st dose, through catheter .............................................  Xylocaine 40 mg  2nd dose, through catheter, after waiting 3 minutes.........Xylocaine 60 mg    ( 1% Xylo charted as a single dose in Epic Meds for ease of charting; actual dosing was fractionated as above, for saftey's sake)  As each dose occurred, patient was free of IV sx; and patient exhibited no evidence of SA injection.  Patient is more comfortable after epidural dosed. Please see RN's note for documentation of vital signs,and FHR which are stable.  Patient reminded not to try to ambulate with numb legs, and that an RN must be present when she attempts to get up.       

## 2014-03-11 NOTE — Anesthesia Preprocedure Evaluation (Addendum)

## 2014-03-11 NOTE — H&P (Addendum)
23 y.o. 2287w4d  G1P0 comes in c/o SROM.  Otherwise has good fetal movement and no bleeding.  Denies any HSV lesions.  Past Medical History  Diagnosis Date  . Herpes     Past Surgical History  Procedure Laterality Date  . Tonsillectomy      OB History  Gravida Para Term Preterm AB SAB TAB Ectopic Multiple Living  1         0    # Outcome Date GA Lbr Len/2nd Weight Sex Delivery Anes PTL Lv  1 CUR               History   Social History  . Marital Status: Single    Spouse Name: N/A    Number of Children: N/A  . Years of Education: N/A   Occupational History  . Not on file.   Social History Main Topics  . Smoking status: Never Smoker   . Smokeless tobacco: Not on file  . Alcohol Use: No  . Drug Use: No  . Sexual Activity: Yes     Comment: last sex 09 March 2014   Other Topics Concern  . Not on file   Social History Narrative  . No narrative on file   Review of patient's allergies indicates no known allergies.    Prenatal Transfer Tool  Maternal Diabetes: No Genetic Screening: Normal Maternal Ultrasounds/Referrals: Normal Fetal Ultrasounds or other Referrals:  None Maternal Substance Abuse:  No Significant Maternal Medications:  None Significant Maternal Lab Results: Lab values include: Group B Strep positive  Other PNC: uncomplicated.    Filed Vitals:   03/11/14 0901  BP: 138/83  Pulse: 83  Temp:   Resp: 20     Lungs/Cor:  NAD Abdomen:  soft, gravid Ex:  no cords, erythema SVE:  Currently 4.5/100/-2 FHTs:  150, good STV, NST R, Cat 1 Toco:  q2-3 No HSV lesions noted on exam.   A/P   Pt admitted overnight with SROM  Pitocin was to be started and verbally ordered by MA, not started as of this am, requested RN started  GBS Pos - PCN  Other routine care.  Philip AspenALLAHAN, Candelaria Pies

## 2014-03-12 LAB — CBC
HCT: 33.3 % — ABNORMAL LOW (ref 36.0–46.0)
HEMOGLOBIN: 10.9 g/dL — AB (ref 12.0–15.0)
MCH: 28.2 pg (ref 26.0–34.0)
MCHC: 32.7 g/dL (ref 30.0–36.0)
MCV: 86.3 fL (ref 78.0–100.0)
Platelets: 128 10*3/uL — ABNORMAL LOW (ref 150–400)
RBC: 3.86 MIL/uL — AB (ref 3.87–5.11)
RDW: 13.4 % (ref 11.5–15.5)
WBC: 11.3 10*3/uL — AB (ref 4.0–10.5)

## 2014-03-12 NOTE — Anesthesia Postprocedure Evaluation (Signed)
  Anesthesia Post-op Note  Patient: Jane Hartman  Procedure(s) Performed: * No procedures listed *  Patient Location: Mother/Baby  Anesthesia Type:Epidural  Level of Consciousness: awake  Airway and Oxygen Therapy: Patient Spontanous Breathing  Post-op Pain: none  Post-op Assessment: Patient's Cardiovascular Status Stable, Respiratory Function Stable, Patent Airway, No signs of Nausea or vomiting, Adequate PO intake, Pain level controlled, No headache, No backache, No residual numbness and No residual motor weakness  Post-op Vital Signs: Reviewed and stable  Last Vitals:  Filed Vitals:   03/12/14 0531  BP: 111/79  Pulse: 76  Temp: 36.7 C  Resp: 18    Complications: No apparent anesthesia complications

## 2014-03-12 NOTE — Progress Notes (Signed)
Post Partum Day 1 Subjective: no complaints, up ad lib, voiding and tolerating PO  Objective: Blood pressure 111/79, pulse 76, temperature 98.1 F (36.7 C), temperature source Oral, resp. rate 18, height 5\' 3"  (1.6 m), weight 92.08 kg (203 lb), last menstrual period 06/14/2013, SpO2 98.00%, unknown if currently breastfeeding.  Physical Exam:  General: alert, cooperative and appears stated age Lochia: appropriate Uterine Fundus: firm   Recent Labs  03/11/14 0105 03/12/14 0525  HGB 12.7 10.9*  HCT 38.8 33.3*    Assessment/Plan: Plan for discharge tomorrow and Breastfeeding   LOS: 2 days   Jane Hartman H. 03/12/2014, 9:34 AM

## 2014-03-12 NOTE — Lactation Note (Signed)
This note was copied from the chart of Jane Keenya Sofranko. Lactation Consultation Note New mom attempting to BF infant in football position, leaning down over to baby pulling on breast. Repositioned mom and baby w/pillows for support. Instructed mom to bring baby to her. Hand expression demonstrated w/positive colostrum. Mom states breast hasn't changed during pregnancy that she could tell. Baby obtained good deep latch. BF basics taught.  Patient Name: Jane [name] Mom encouraged to feed baby 8-12 times/24 hours and with feeding cues. Mom encouraged to feed baby w/feeding cuesWH/LC brochure given w/resources, support groups and LC services. Encouraged to call for assistance if needed and to verify proper latch.   Today's Date: 03/12/2014 Reason for consult: Initial assessment   Maternal Data    Feeding Feeding Type: Breast Fed Length of feed: 20 min  LATCH Score/Interventions Latch: Grasps breast easily, tongue down, lips flanged, rhythmical sucking. Intervention(s): Skin to skin;Teach feeding cues;Waking techniques Intervention(s): Adjust position;Assist with latch;Breast massage;Breast compression  Audible Swallowing: None Intervention(s): Skin to skin;Hand expression  Type of Nipple: Everted at rest and after stimulation  Comfort (Breast/Nipple): Soft / non-tender     Hold (Positioning): Assistance needed to correctly position infant at breast and maintain latch. Intervention(s): Breastfeeding basics reviewed;Support Pillows;Position options;Skin to skin  LATCH Score: 7  Lactation Tools Discussed/Used     Consult Status Consult Status: Follow-up Date: 03/13/14    Charyl DancerCARVER, Taiwana Willison G 03/12/2014, 2:32 AM

## 2014-03-13 MED ORDER — IBUPROFEN 600 MG PO TABS: 600.0000 mg | ORAL_TABLET | Freq: Four times a day (QID) | ORAL | Status: AC | PRN

## 2014-03-13 MED ORDER — SENNOSIDES-DOCUSATE SODIUM 8.6-50 MG PO TABS: 2.0000 | ORAL_TABLET | Freq: Every evening | ORAL | Status: AC | PRN

## 2014-03-13 NOTE — Lactation Note (Signed)
This note was copied from the chart of Jane Alle Rennaker. Lactation Consultation Note  Patient Name: Jane Hartman: 03/13/2014 Reason for consult: Follow-up assessment Per mom breast feeding is going well and breast are feeling fuller both breast,  Have tried the hand pump and feel comfortable using it. LC reviewed sore nipple and engorgement prevention and tx. Referring to the baby and me booklet, Mother informed of post-discharge support and given phone number to the lactation department,  including services for phone call assistance; out-patient appointments; and breastfeeding support group. List of other breastfeeding resources  in the community given in the handout. Encouraged mother to call for problems or concerns related to breastfeeding.    Maternal Data    Feeding Feeding Type: Breast Fed Length of feed: 30 min  LATCH Score/Interventions                Intervention(s): Breastfeeding basics reviewed     Lactation Tools Discussed/Used Tools: Pump Breast pump type: Manual (per mom has already been instructed ) [facility] Pump Review: Milk Storage Initiated by:: MAI  Hartman initiated:: 03/13/14   Consult Status Consult Status: Complete Hartman: 03/13/14    Kathrin Greathouseorio, Jannah Guardiola Ann 03/13/2014, 11:12 AM

## 2014-03-13 NOTE — Progress Notes (Signed)
Post Partum Day 2 Subjective: no complaints, up ad lib, voiding, tolerating PO, + flatus and Breast feeding  Objective: Blood pressure 113/72, pulse 72, temperature 98.3 F (36.8 C), temperature source Oral, resp. rate 18, height 5\' 3"  (1.6 m), weight 92.08 kg (203 lb), last menstrual period 06/14/2013, SpO2 96.00%, unknown if currently breastfeeding.  Physical Exam:  General: alert, cooperative and no distress Lochia: appropriate Uterine Fundus: firm DVT Evaluation: No evidence of DVT seen on physical exam. Negative Homan's sign. No cords or calf tenderness.   Recent Labs  03/11/14 0105 03/12/14 0525  HGB 12.7 10.9*  HCT 38.8 33.3*    Assessment/Plan: Discharge home, Breastfeeding and Contraception will discuss at post partum visit   LOS: 3 days   Jane Hartman STACIA 03/13/2014, 8:49 AM

## 2014-03-13 NOTE — Discharge Instructions (Signed)
Vaginal Delivery °Care After °Refer to this sheet in the next few weeks. These discharge instructions provide you with information on caring for yourself after delivery. Your caregiver may also give you specific instructions. Your treatment has been planned according to the most current medical practices available, but problems sometimes occur. Call your caregiver if you have any problems or questions after you go home. °HOME CARE INSTRUCTIONS °· Take over-the-counter or prescription medicines only as directed by your caregiver or pharmacist. °· Do not drink alcohol, especially if you are breastfeeding or taking medicine to relieve pain. °· Do not chew or smoke tobacco. °· Do not use illegal drugs. °· Continue to use good perineal care. Good perineal care includes: °¨ Wiping your perineum from front to back. °¨ Keeping your perineum clean. °· Do not use tampons or douche until your caregiver says it is okay. °· Shower, wash your hair, and take tub baths as directed by your caregiver. °· Wear a well-fitting bra that provides breast support. °· Eat healthy foods. °· Drink enough fluids to keep your urine clear or pale yellow. °· Eat high-fiber foods such as whole grain cereals and breads, brown rice, beans, and fresh fruits and vegetables every day. These foods may help prevent or relieve constipation. °· Follow your cargiver's recommendations regarding resumption of activities such as climbing stairs, driving, lifting, exercising, or traveling. °· Talk to your caregiver about resuming sexual activities. Resumption of sexual activities is dependent upon your risk of infection, your rate of healing, and your comfort and desire to resume sexual activity. °· Try to have someone help you with your household activities and your newborn for at least a few days after you leave the hospital. °· Rest as much as possible. Try to rest or take a nap when your newborn is sleeping. °· Increase your activities gradually. °· Keep all  of your scheduled postpartum appointments. It is very important to keep your scheduled follow-up appointments. At these appointments, your caregiver will be checking to make sure that you are healing physically and emotionally. °SEEK MEDICAL CARE IF:  °· You are passing large clots from your vagina. Save any clots to show your caregiver. °· You have a foul smelling discharge from your vagina. °· You have trouble urinating. °· You are urinating frequently. °· You have pain when you urinate. °· You have a change in your bowel movements. °· You have increasing redness, pain, or swelling near your vaginal incision (episiotomy) or vaginal tear. °· You have pus draining from your episiotomy or vaginal tear. °· Your episiotomy or vaginal tear is separating. °· You have painful, hard, or reddened breasts. °· You have a severe headache. °· You have blurred vision or see spots. °· You feel sad or depressed. °· You have thoughts of hurting yourself or your newborn. °· You have questions about your care, the care of your newborn, or medicines. °· You are dizzy or lightheaded. °· You have a rash. °· You have nausea or vomiting. °· You were breastfeeding and have not had a menstrual period within 12 weeks after you stopped breastfeeding. °· You are not breastfeeding and have not had a menstrual period by the 12th week after delivery. °· You have a fever. °SEEK IMMEDIATE MEDICAL CARE IF:  °· You have persistent pain. °· You have chest pain. °· You have shortness of breath. °· You faint. °· You have leg pain. °· You have stomach pain. °· Your vaginal bleeding saturates two or more sanitary pads   in 1 hour. °MAKE SURE YOU:  °· Understand these instructions. °· Will watch your condition. °· Will get help right away if you are not doing well or get worse. ° ° °Document Released: 08/06/2000 Document Revised: 05/03/2012 Document Reviewed: 04/05/2012 °[employer]® Patient Information ©2015 [employer]. This information is not intended to  replace advice given to you by your health care provider. Make sure you discuss any questions you have with your health care provider. ° °

## 2014-03-13 NOTE — Discharge Summary (Signed)
Obstetric Discharge Summary Reason for Admission: onset of labor Prenatal Procedures: none Intrapartum Procedures: spontaneous vaginal delivery Postpartum Procedures: none Complications-Operative and Postpartum: none Hemoglobin  Date Value Ref Range Status  03/12/2014 10.9* 12.0 - 15.0 g/dL Final     HCT  Date Value Ref Range Status  03/12/2014 33.3* 36.0 - 46.0 % Final    Physical Exam:  General: alert, cooperative and no distress Lochia: appropriate Uterine Fundus: firm DVT Evaluation: No evidence of DVT seen on physical exam. Negative Homan's sign. No cords or calf tenderness.  Discharge Diagnoses: Term Pregnancy-delivered  Discharge Information: Date: 03/13/2014 Activity: pelvic rest Diet: routine Medications: PNV, Ibuprofen and Colace Condition: stable Instructions: refer to practice specific booklet Discharge to: home   Newborn Data: Live born female  Birth Weight: 7 lb (3175 g) APGAR: 8, 9  Home with mother.  Jane Hartman, Jane Hartman 03/13/2014, 8:52 AM

## 2014-06-24 ENCOUNTER — Encounter (HOSPITAL_COMMUNITY): Payer: Self-pay | Admitting: *Deleted

## 2014-12-30 IMAGING — US US OB COMP LESS 14 WK
1 series · 14 of 22 positions shown · non-contrast
Comparison: None.

CLINICAL DATA: Threatened abortion.

EXAM:
OBSTETRIC <14 WK ULTRASOUND
TECHNIQUE: Transabdominal ultrasound was performed for evaluation of the
gestation as well as the maternal uterus and adnexal regions.

[Series 1: us ob transvaginal · 22 acquisitions, 14 frames shown]
[im 1/22]
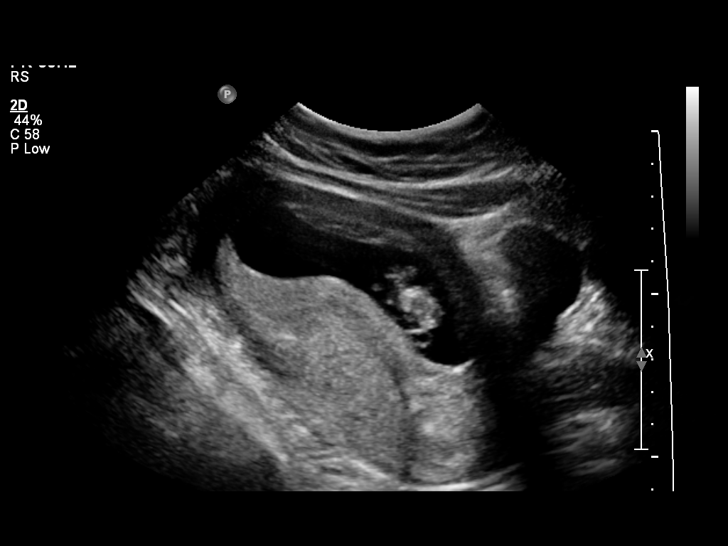
[im 3/22]
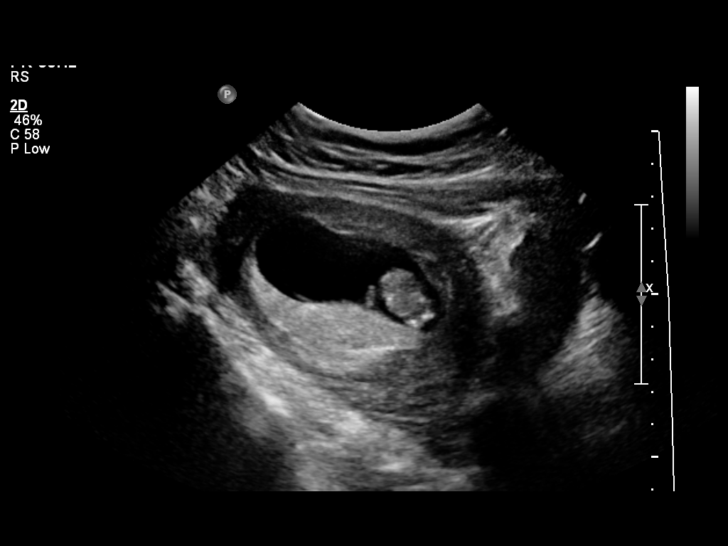
[im 4/22]
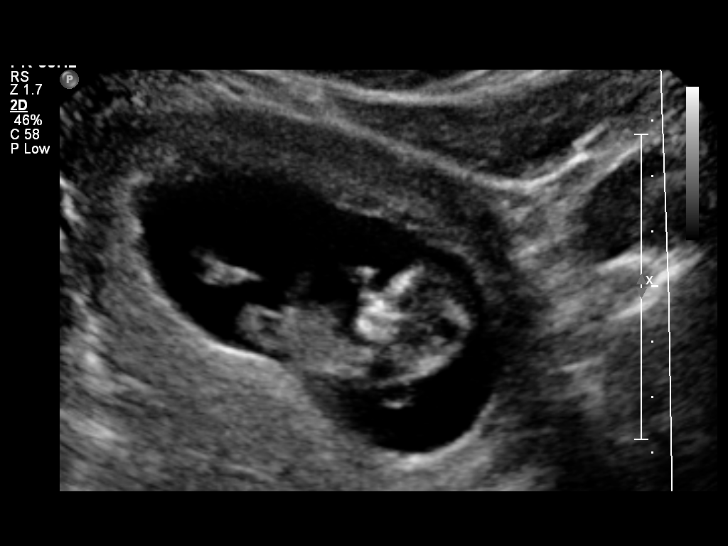
[im 6/22]
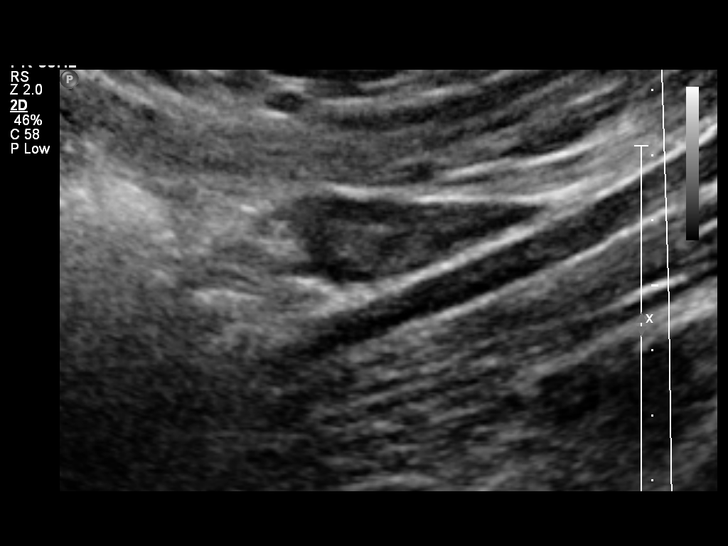
[im 8/22]
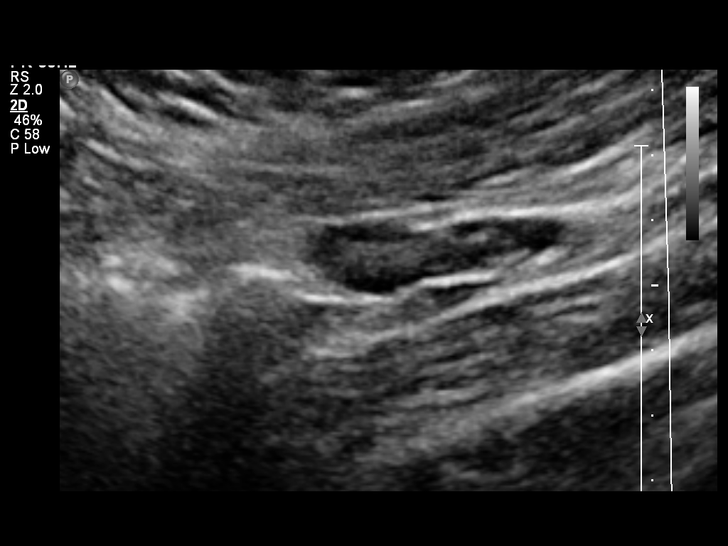
[im 9/22]
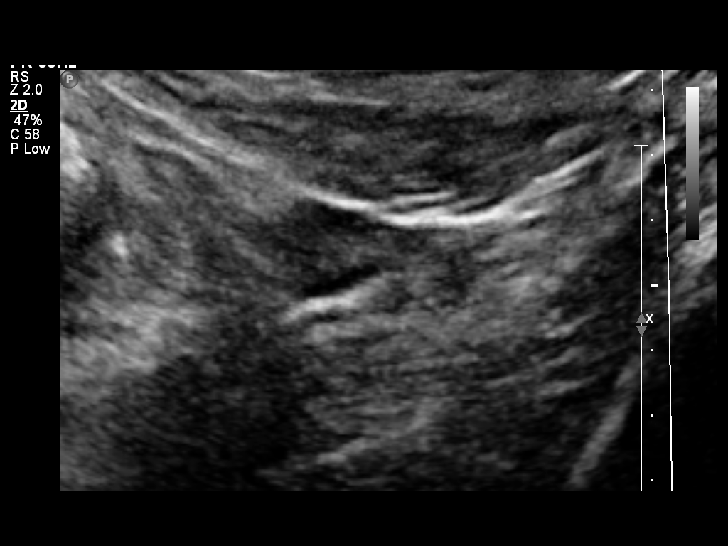
[im 11/22]
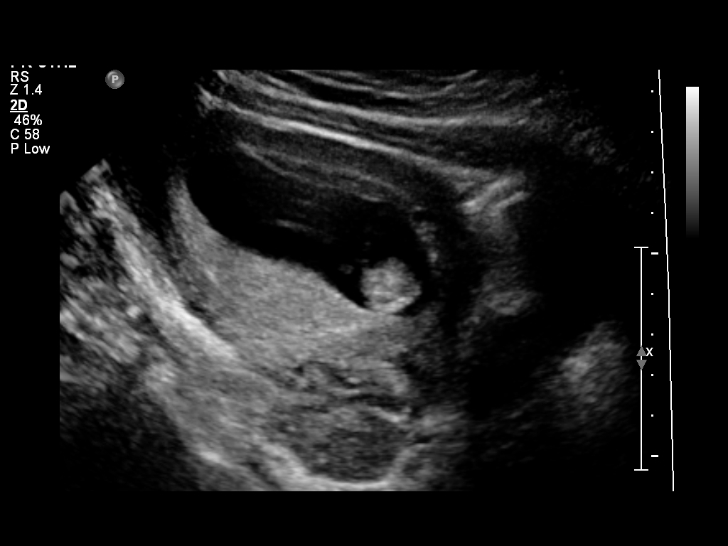
[im 12/22]
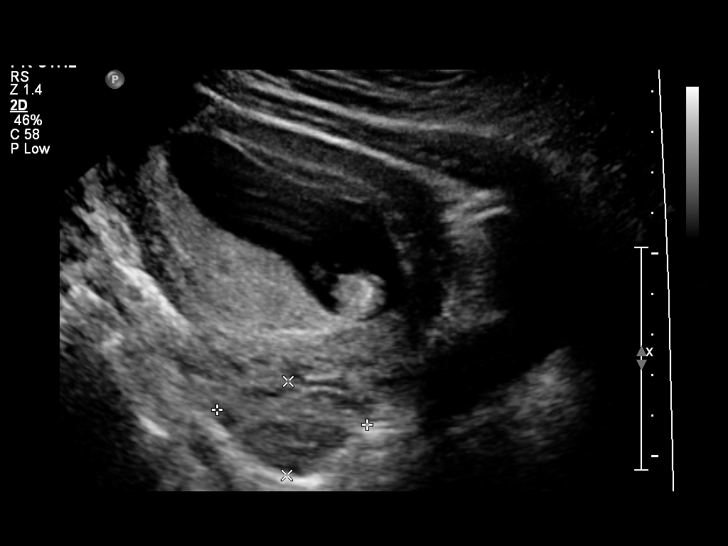
[im 14/22]
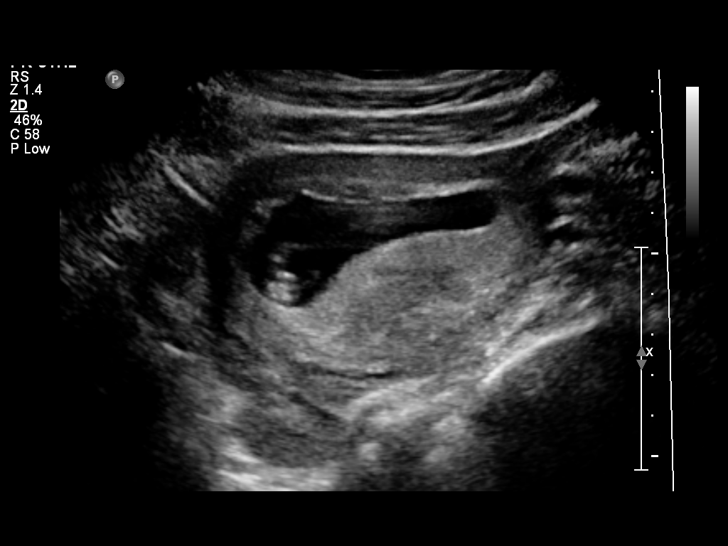
[im 15/22]
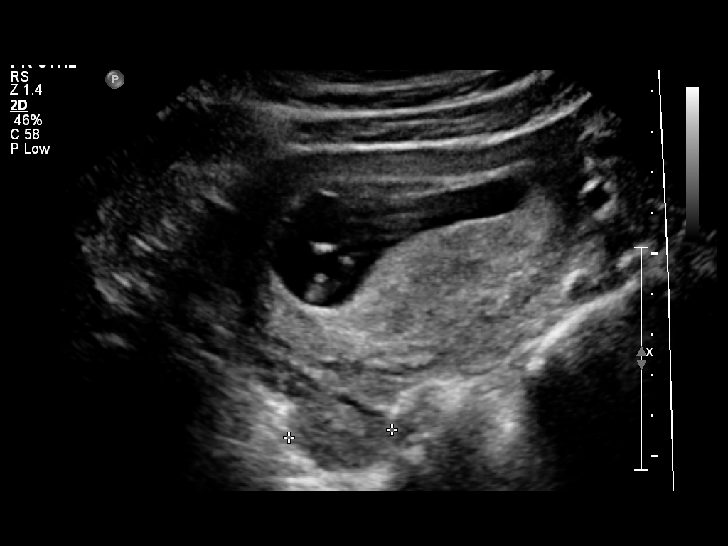
[im 17/22]
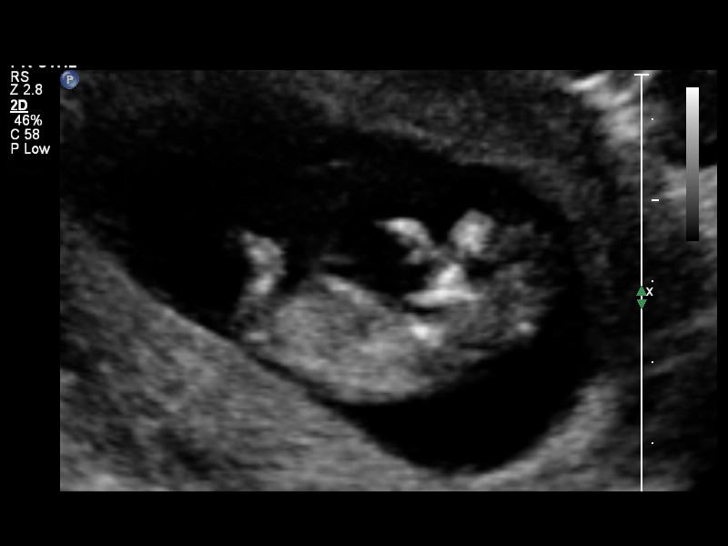
[im 19/22]
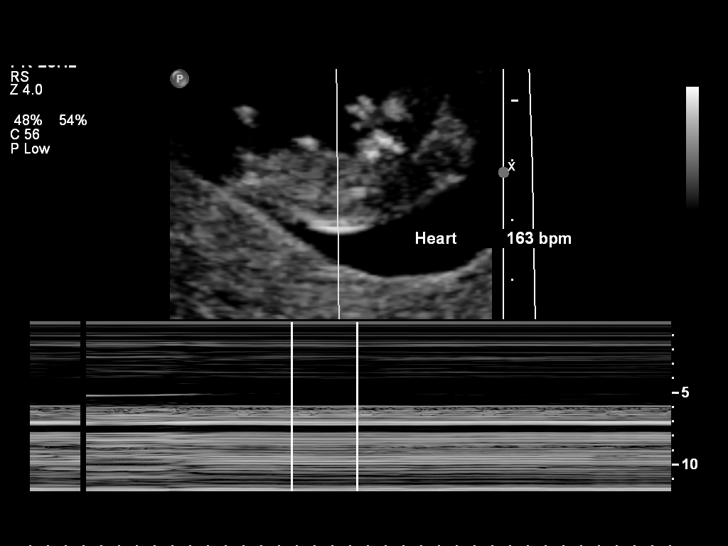
[im 20/22]
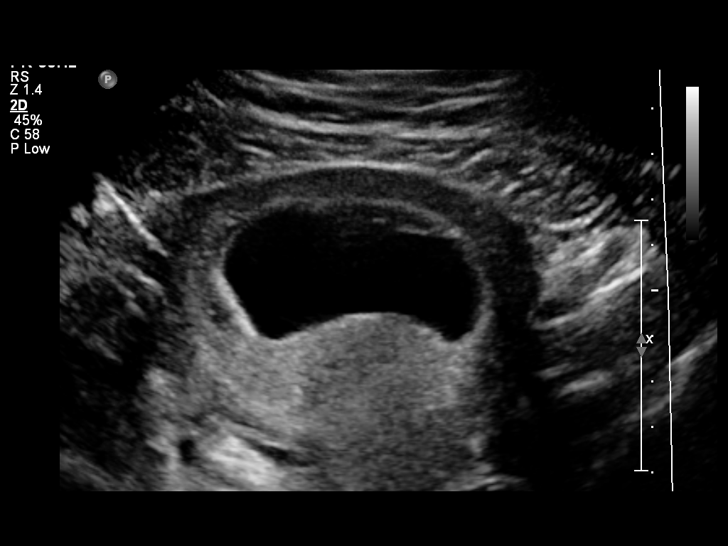
[im 22/22]
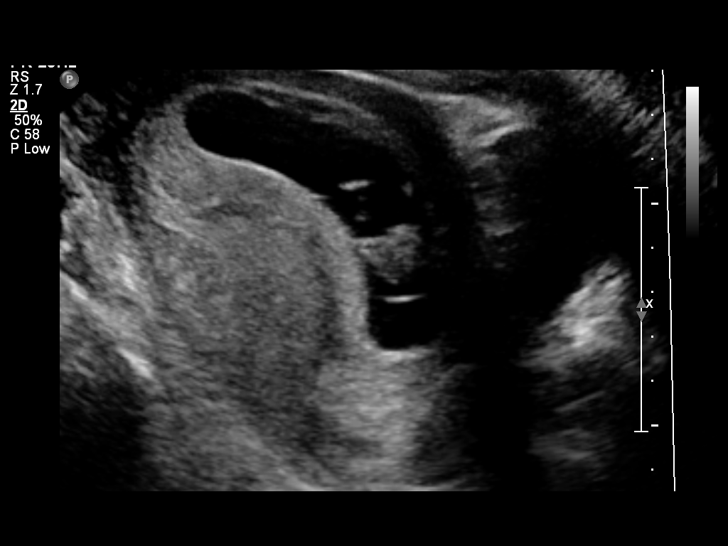

[14 of 22 positions shown; findings below may reference images not displayed]

FINDINGS: Intrauterine gestational sac: Visualized/normal in shape.

Yolk sac:  Visualized

Embryo:  Visualized

Cardiac Activity: Visualized

Heart Rate: 163 bpm

CRL:   42  mm   11 w 1 d                  US EDC: 03/17/2014

Maternal uterus/adnexae: No mass or free fluid identified. Both
ovaries are normal in appearance.
IMPRESSION: Single living IUP measuring 11 weeks 1 day with US EDC of
03/17/2014.

No significant maternal uterine or adnexal abnormality identified.

## 2015-05-06 IMAGING — US US SOFT TISSUE HEAD/NECK
1 series · 14 of 25 positions shown · non-contrast
Comparison: None.

CLINICAL DATA: Swelling of the submental soft tissues ; the patient
is 28 weeks pregnant

EXAM:
ULTRASOUND OF HEAD/NECK SOFT TISSUES
TECHNIQUE: Ultrasound examination of the head and neck soft tissues was
performed in the area of clinical concern.

[Series 1: us soft tissue head/neck · 0.06mm/px · 26 acquisitions, 14 frames shown]
[im 1/26]
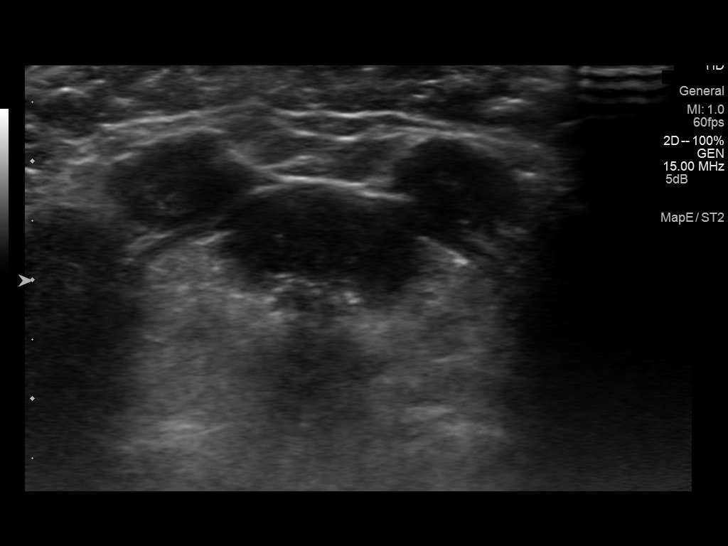
[im 3/26]
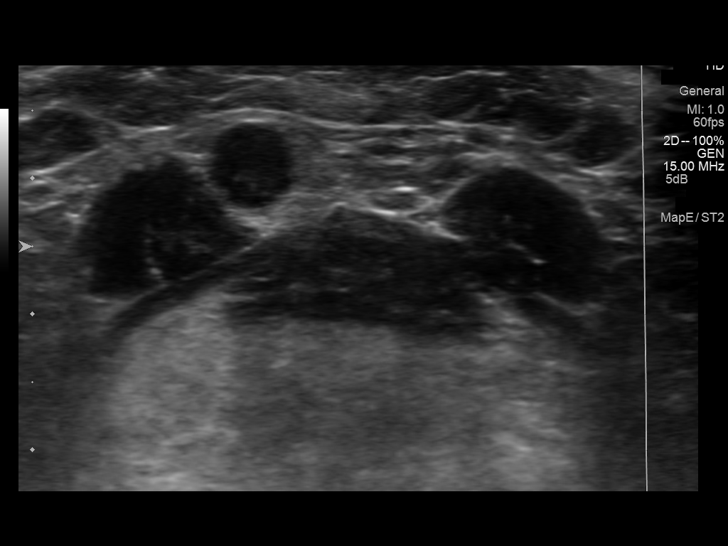
[im 5/26]
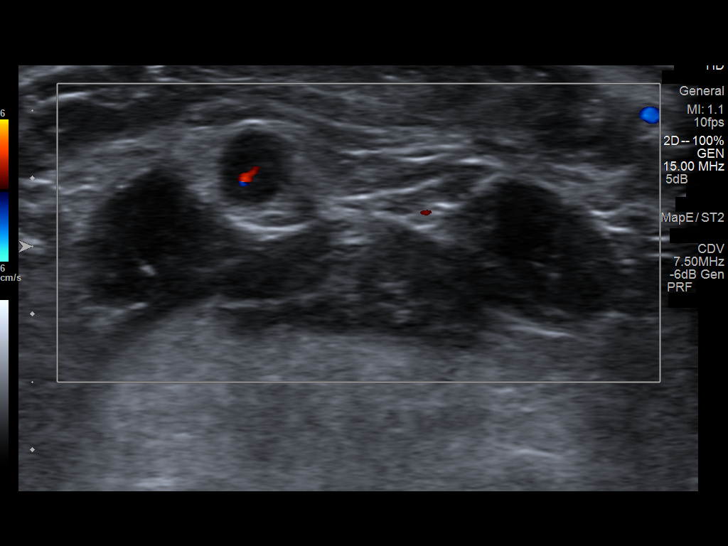
[im 7/26]
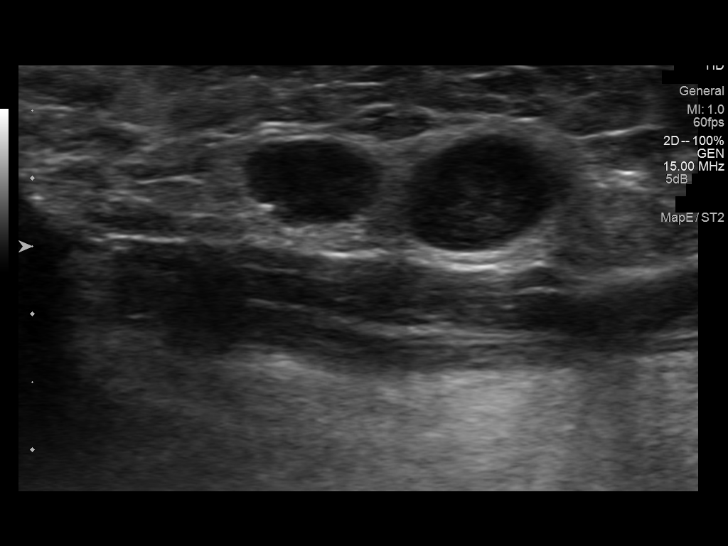
[im 9/26]
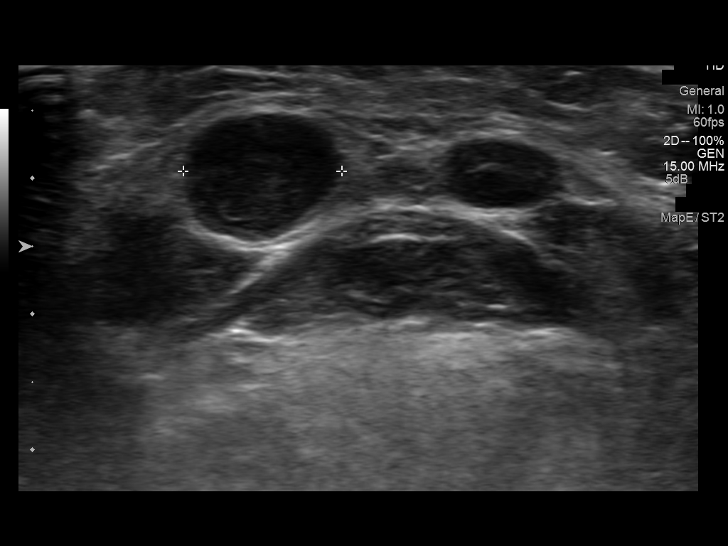
[im 10/26]
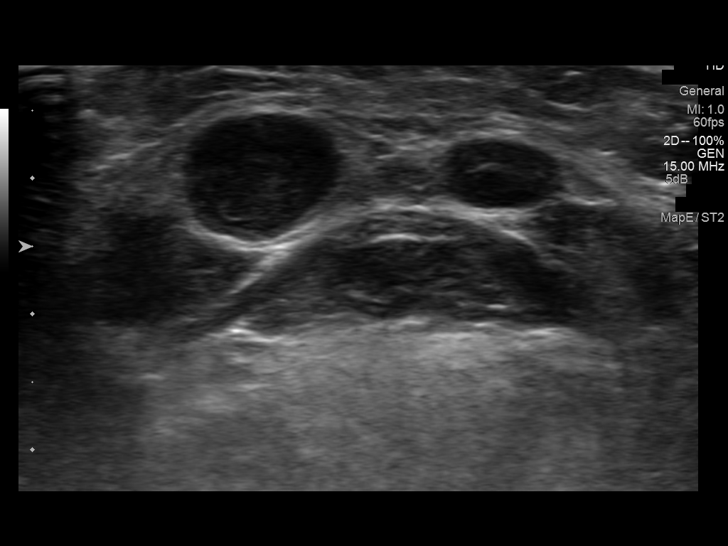
[im 12/26]
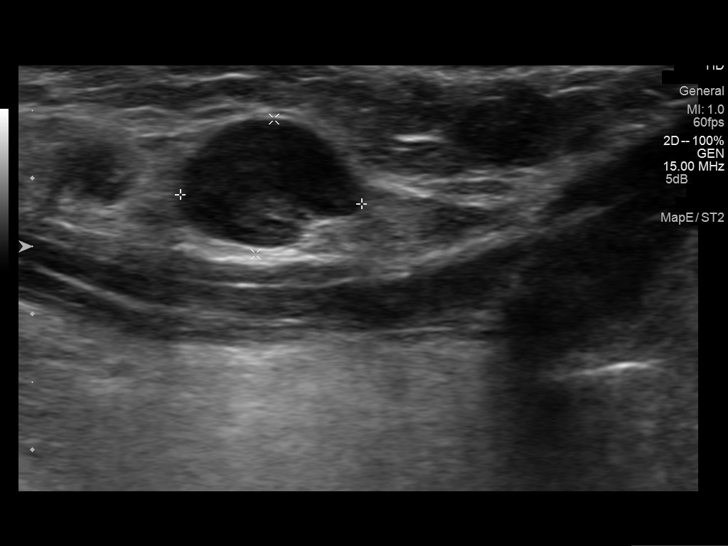
[im 14/26]
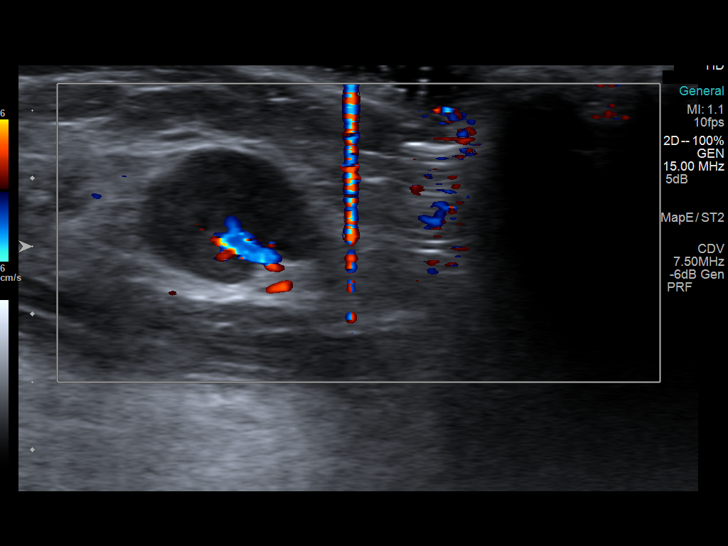
[im 16/26]
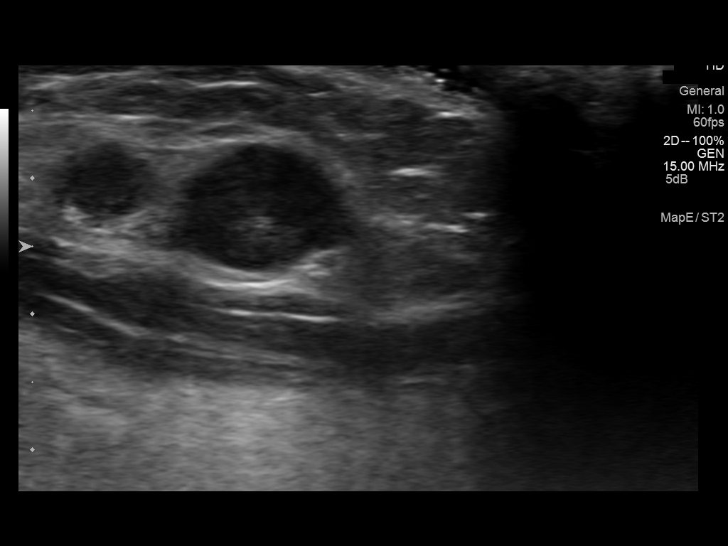
[im 17/26]
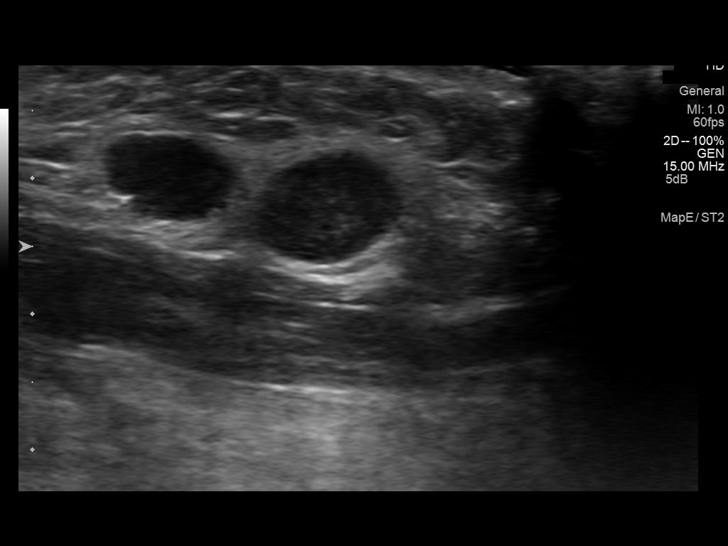
[im 19/26]
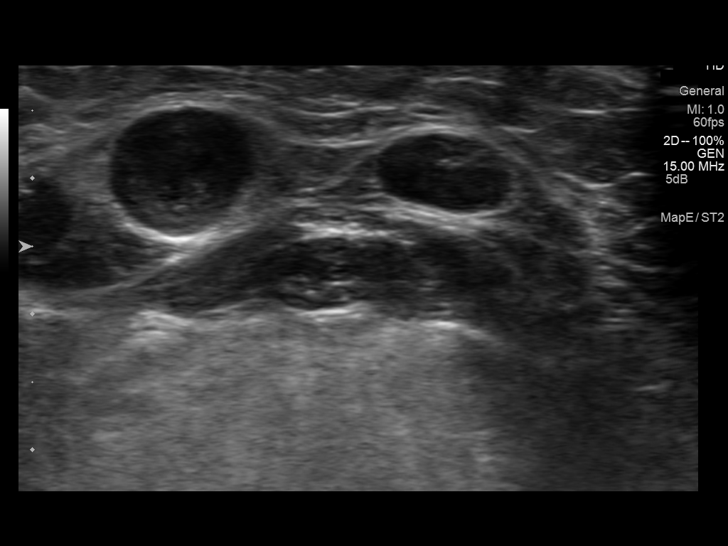
[im 21/26]
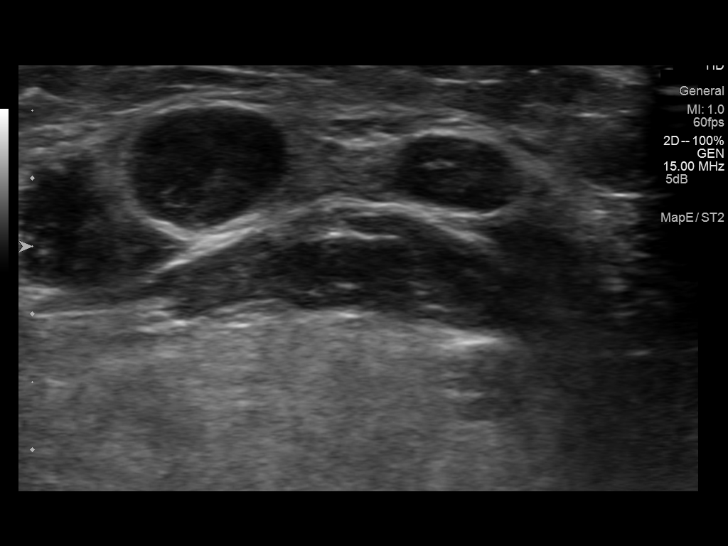
[im 23/26]
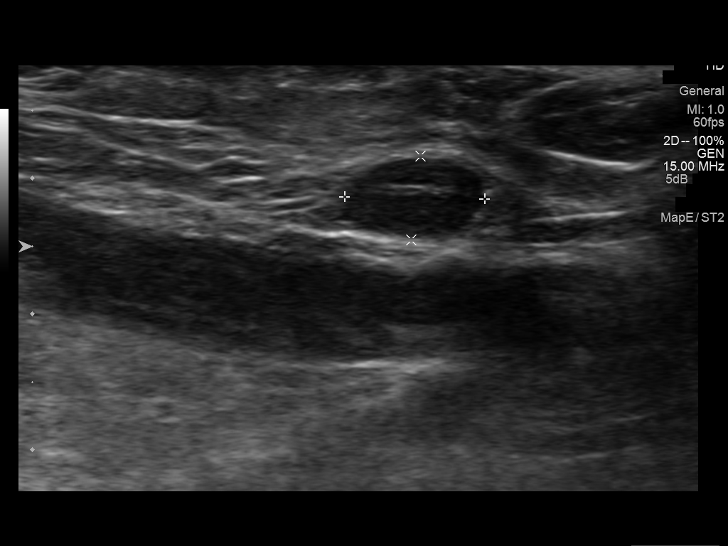
[im 26/26]
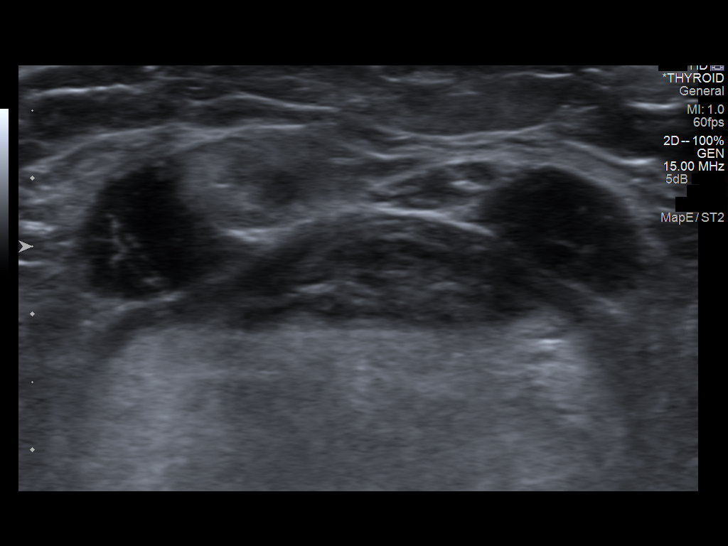

[14 of 25 positions shown; findings below may reference images not displayed]

FINDINGS: In the submental region there are prominent hypoechoic structures
compatible with borderline to mildly enlarged lymph nodes. On the
right a node measuring 1.3 x 1.0 x 1.2 cm is demonstrated. An
adjacent 1.0 x 0.7 x 0.7 cm diameter hypoechoic nodule is present.
On the left there is a 1.0 x 0.6 x 1.0 cm hypoechoic nodule.
Vascularity is present within each in a fashion consistent with a
hilar architecture.
IMPRESSION: There are hypoechoic nodules in the submental region consistent with
borderline to mildly enlarged lymph nodes.
